# Patient Record
Sex: Female | Born: 1965 | Race: Black or African American | Hispanic: No | Marital: Single | Smoking: Never smoker
Health system: Southern US, Community
[De-identification: ages and names within clinical notes are randomized; demographics above are authoritative.]

## PROBLEM LIST (undated history)

## (undated) HISTORY — PX: TUBAL LIGATION: SHX77

---

## 2000-02-06 ENCOUNTER — Emergency Department (HOSPITAL_COMMUNITY): Admission: EM | Admit: 2000-02-06 | Discharge: 2000-02-06 | Payer: Self-pay | Admitting: Emergency Medicine

## 2000-11-13 ENCOUNTER — Emergency Department (HOSPITAL_COMMUNITY): Admission: EM | Admit: 2000-11-13 | Discharge: 2000-11-13 | Payer: Self-pay | Admitting: Emergency Medicine

## 2000-12-09 ENCOUNTER — Emergency Department (HOSPITAL_COMMUNITY): Admission: EM | Admit: 2000-12-09 | Discharge: 2000-12-09 | Payer: Self-pay | Admitting: Emergency Medicine

## 2001-01-09 ENCOUNTER — Emergency Department (HOSPITAL_COMMUNITY): Admission: EM | Admit: 2001-01-09 | Discharge: 2001-01-09 | Payer: Self-pay | Admitting: Emergency Medicine

## 2001-04-24 ENCOUNTER — Emergency Department (HOSPITAL_COMMUNITY): Admission: EM | Admit: 2001-04-24 | Discharge: 2001-04-25 | Payer: Self-pay

## 2001-04-25 ENCOUNTER — Encounter: Payer: Self-pay | Admitting: Emergency Medicine

## 2003-04-10 ENCOUNTER — Inpatient Hospital Stay (HOSPITAL_COMMUNITY): Admission: AD | Admit: 2003-04-10 | Discharge: 2003-04-10 | Payer: Self-pay | Admitting: *Deleted

## 2004-01-05 ENCOUNTER — Encounter (INDEPENDENT_AMBULATORY_CARE_PROVIDER_SITE_OTHER): Payer: Self-pay | Admitting: *Deleted

## 2004-01-05 ENCOUNTER — Encounter: Admission: RE | Admit: 2004-01-05 | Discharge: 2004-01-05 | Payer: Self-pay | Admitting: Obstetrics and Gynecology

## 2004-08-22 ENCOUNTER — Emergency Department (HOSPITAL_COMMUNITY): Admission: EM | Admit: 2004-08-22 | Discharge: 2004-08-22 | Payer: Self-pay

## 2006-05-20 ENCOUNTER — Emergency Department (HOSPITAL_COMMUNITY): Admission: EM | Admit: 2006-05-20 | Discharge: 2006-05-20 | Payer: Self-pay | Admitting: Family Medicine

## 2006-06-22 ENCOUNTER — Emergency Department (HOSPITAL_COMMUNITY): Admission: EM | Admit: 2006-06-22 | Discharge: 2006-06-22 | Payer: Self-pay | Admitting: Family Medicine

## 2006-07-08 ENCOUNTER — Emergency Department (HOSPITAL_COMMUNITY): Admission: EM | Admit: 2006-07-08 | Discharge: 2006-07-08 | Payer: Self-pay | Admitting: Family Medicine

## 2008-05-30 ENCOUNTER — Ambulatory Visit: Payer: Self-pay | Admitting: Internal Medicine

## 2008-08-19 ENCOUNTER — Emergency Department (HOSPITAL_COMMUNITY): Admission: EM | Admit: 2008-08-19 | Discharge: 2008-08-19 | Payer: Self-pay | Admitting: Family Medicine

## 2008-11-14 ENCOUNTER — Emergency Department (HOSPITAL_COMMUNITY): Admission: EM | Admit: 2008-11-14 | Discharge: 2008-11-14 | Payer: Self-pay | Admitting: Family Medicine

## 2009-09-27 ENCOUNTER — Emergency Department (HOSPITAL_COMMUNITY): Admission: EM | Admit: 2009-09-27 | Discharge: 2009-09-27 | Payer: Self-pay | Admitting: Family Medicine

## 2010-08-14 ENCOUNTER — Emergency Department (HOSPITAL_COMMUNITY)
Admission: EM | Admit: 2010-08-14 | Discharge: 2010-08-14 | Payer: Self-pay | Source: Home / Self Care | Admitting: Family Medicine

## 2010-08-14 ENCOUNTER — Ambulatory Visit: Admission: RE | Admit: 2010-08-14 | Discharge: 2010-08-14 | Payer: Self-pay | Admitting: Family Medicine

## 2010-08-14 ENCOUNTER — Encounter: Payer: Self-pay | Admitting: Family Medicine

## 2010-08-14 ENCOUNTER — Ambulatory Visit: Payer: Self-pay | Admitting: Vascular Surgery

## 2010-08-21 ENCOUNTER — Emergency Department (HOSPITAL_COMMUNITY)
Admission: EM | Admit: 2010-08-21 | Discharge: 2010-08-21 | Payer: Self-pay | Source: Home / Self Care | Admitting: Family Medicine

## 2010-09-03 ENCOUNTER — Emergency Department (HOSPITAL_COMMUNITY)
Admission: EM | Admit: 2010-09-03 | Discharge: 2010-09-03 | Payer: Self-pay | Source: Home / Self Care | Admitting: Emergency Medicine

## 2010-09-03 ENCOUNTER — Ambulatory Visit: Payer: Self-pay | Admitting: Surgery

## 2011-05-10 NOTE — Group Therapy Note (Signed)
NAME:  Sydney Sydney [name], Sydney Sydney [name]                            ACCOUNT NO.:  [id number]   MEDICAL RECORD NO.:  [id number]                   PATIENT TYPE:  OUT   LOCATION:  [facility]                           FACILITY:  [facility]   PHYSICIAN:  Tinnie Gens, MD                     DATE OF BIRTH:  11/25/1966   DATE OF SERVICE:  01/05/2004                                    CLINIC NOTE   CHIEF COMPLAINT:  Follow-up STD.   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  She has  had one outbreak.  She was culture positive.  She has had no outbreaks since  then.  She is really here just for follow-up and is without other complaints  today.   PAST MEDICAL HISTORY:  Negative.   PAST SURGICAL HISTORY:  BTL in 1989.   PAST OBSTETRICAL HISTORY:  G3, P3, NSVD x3.   PAST GYNECOLOGIC HISTORY:  Menarche at age 67, irregular, approximately 2-3  months apart, last for seven days.  No history of abnormal Paps.  Last Pap  smear approximately 15 years ago.   FAMILY HISTORY:  Coronary artery disease.   SOCIAL HISTORY:  No tobacco, alcohol, or drug use.  She lives with her  children.  She works at [employer].   REVIEW OF SYSTEMS:  14 point review of systems is reviewed and negative.   PHYSICAL EXAMINATION:  VITAL SIGNS:  Temperature 98.5, pulse 94, blood  pressure 96/62, weight 98.6 pounds, height 4 feet 11 inches.  GENERAL:  She is Sydney [name] very thin black female in no acute distress.  ABDOMEN:  Soft and nontender.  GENITOURINARY:  Normal external female genitalia.  The vagina is rugated and  without discharge.  The cervix is anterior and deviated to the patient's  right but no mass could be appreciated.  The uterus fills retroverted.  The  adnexa cannot fully be appreciated.   IMPRESSION:  1. Gynecologic examination.  2. Herpes simplex virus outbreak previously, none currently.  No symptoms.   PLAN:  1. Pap smear today.   Discussed need for mammography at age 74.  Discussed     self breast examination.  2. Discussed HSV and usual cycles as well as possible need for suppression     should outbreaks become more frequent.  The patient understood all this.     She will return as necessary.                                               Tinnie Gens, MD    TP/MEDQ  D:  01/05/2004  T:  01/05/2004  Job:  [id number]

## 2011-09-24 LAB — GC/CHLAMYDIA PROBE AMP, GENITAL
Chlamydia, DNA Probe: NEGATIVE
GC Probe Amp, Genital: NEGATIVE

## 2011-09-24 LAB — POCT URINALYSIS DIP (DEVICE)
Bilirubin Urine: NEGATIVE
Glucose, UA: NEGATIVE mg/dL
Nitrite: NEGATIVE
Specific Gravity, Urine: <=1.005 (ref 1.005–1.030)

## 2011-09-24 LAB — WET PREP, GENITAL: Clue Cells Wet Prep HPF POC: NONE SEEN

## 2014-06-09 ENCOUNTER — Encounter (HOSPITAL_COMMUNITY): Payer: Self-pay | Admitting: Emergency Medicine

## 2014-06-09 ENCOUNTER — Emergency Department (HOSPITAL_COMMUNITY)
Admission: EM | Admit: 2014-06-09 | Discharge: 2014-06-09 | Disposition: A | Discharge status: Home / Self Care | Payer: Self-pay | Attending: Emergency Medicine | Admitting: Emergency Medicine

## 2014-06-09 DIAGNOSIS — H6120 Impacted cerumen, unspecified ear: Secondary | ICD-10-CM | POA: Insufficient documentation

## 2014-06-09 DIAGNOSIS — H6122 Impacted cerumen, left ear: Secondary | ICD-10-CM

## 2014-06-09 DIAGNOSIS — H9202 Otalgia, left ear: Secondary | ICD-10-CM

## 2014-06-09 MED ORDER — CARBAMIDE PEROXIDE 6.5 % OT SOLN: 5.0000 [drp] | Freq: Two times a day (BID) | OTIC | Status: DC

## 2014-06-09 NOTE — ED Provider Notes (Signed)
CSN: [id number]     Arrival date & time 06/09/14  1127 History   First MD Initiated Contact with Patient 06/09/14 1134    This chart was scribed for Sydney FinnerErin O'Malley PA-C, a non-physician practitioner working with Layla MawKristen N Ward, DO by Lewanda RifeAlexandra Hurtado, ED Scribe. This patient was seen in room TR07C/TR07C and the patient's care was started at 1:07 PM      Chief Complaint  Patient presents with  . Otalgia     (Consider location/radiation/quality/duration/timing/severity/associated sxs/prior Treatment) The history is provided by the patient. No language interpreter was used.   HPI Comments: [name] is a 48 y.o. female who presents to the Emergency Department complaining of constant left sided otalgia onset several days. Describes pain as 4/10 in severity and popping sensation in ear. Denies any aggravating or trying any alleviating factors. Denies associated fever, recent swimming, recent trauma, recent travel, recent air plane trips, and ear drainage.   History reviewed. No pertinent past medical history. History reviewed. No pertinent past surgical history. History reviewed. No pertinent family history. History  Substance Use Topics  . Smoking status: Not on file  . Smokeless tobacco: Not on file  . Alcohol Use: No   OB History   Grav Para Term Preterm Abortions TAB SAB Ect Mult Living                 Review of Systems  Constitutional: Negative for fever.  HENT: Positive for ear pain.       Allergies  Review of patient's allergies indicates no known allergies.  Home Medications   Prior to Admission medications   Medication Sig Start Date End Date Taking? Authorizing Provider  carbamide peroxide (DEBROX) 6.5 % otic solution Place 5 drops into the left ear 2 (two) times daily. 06/09/14   Sydney FinnerErin O'Malley, PA-C   BP 110/68  Pulse 94  Temp(Src) 98.3 F (36.8 C) (Oral)  Resp 18  SpO2 98% Physical Exam  Nursing note and vitals reviewed. Constitutional: She is oriented  to person, place, and time. She appears well-developed and well-nourished.  HENT:  Head: Normocephalic and atraumatic.  Cerumen impaction in left ear   Eyes: EOM are normal.  Neck: Normal range of motion.  Cardiovascular: Normal rate.   Pulmonary/Chest: Effort normal.  Musculoskeletal: Normal range of motion.  Neurological: She is alert and oriented to person, place, and time.  Skin: Skin is warm and dry.  Psychiatric: She has a normal mood and affect. Her behavior is normal.    ED Course  Procedures   Ceruminosis is noted.  Wax is removed by syringing and manual debridement. Instructions for home care to prevent wax buildup are given.  COORDINATION OF CARE:  Nursing notes reviewed. Vital signs reviewed. Initial pt interview and examination performed.   Filed Vitals:   06/09/14 1134  BP: 110/68  Pulse: 94  Temp: 98.3 F (36.8 C)  TempSrc: Oral  Resp: 18  SpO2: 98%    1:07 PM-Discussed work up plan with pt at bedside, which includes  Orders Placed This Encounter  Procedures  . Ear wax removal    Left ear    Standing Status: Standing     Number of Occurrences: 1     Standing Expiration Date:   Pt agrees with plan.   Treatment plan initiated:Medications - No data to display   Initial diagnostic testing ordered.      Labs Review Labs Reviewed - No data to display  Imaging Review No results found.  EKG Interpretation None      MDM   Final diagnoses:  Otalgia of left ear  Impacted cerumen of left ear    Pt is a 48yo female c/o left ear pain, no fever, n/v/d. Cerumen impaction in left ear.  Large amount of cerumen removed from left ear.  Advised to to use acetaminophen and ibuprofen for pain and use Debrox to help soften remaining cerumen. Advised to f/u with PCP. Return precautions provided. Pt verbalized understanding and agreement with tx plan.   I personally performed the services described in this documentation, which was scribed in my presence.  The recorded information has been reviewed and is accurate.    Sydney Finnerrin O'Malley, PA-C 06/09/14 1312

## 2014-06-09 NOTE — ED Notes (Signed)
Reports left ear pain and popping sensation for several days.

## 2014-06-09 NOTE — Discharge Instructions (Signed)
Cerumen Impaction °A cerumen impaction is when the wax in your ear forms a plug. This plug usually causes reduced hearing. Sometimes it also causes an earache or dizziness. Removing a cerumen impaction can be difficult and painful. The wax sticks to the ear canal. The canal is sensitive and bleeds easily. If you try to remove a heavy wax buildup with a cotton tipped swab, you may push it in further. °Irrigation with water, suction, and small ear curettes may be used to clear out the wax. If the impaction is fixed to the skin in the ear canal, ear drops may be needed for a few days to loosen the wax. People who build up a lot of wax frequently can use ear wax removal products available in your local drugstore. °SEEK MEDICAL CARE IF:  °You develop an earache, increased hearing loss, or marked dizziness. °Document Released: 01/16/2005 Document Revised: 03/02/2012 Document Reviewed: 03/08/2010 °[employer]® Patient Information ©2015 [employer]. This information is not intended to replace advice given to you by your health care provider. Make sure you discuss any questions you have with your health care provider. ° °

## 2014-06-09 NOTE — ED Notes (Signed)
Pt comfortable with discharge and follow up instructions. Prescriptions x1. Pt declines wheelchair.

## 2014-06-09 NOTE — ED Provider Notes (Signed)
Medical screening examination/treatment/procedure(s) were performed by non-physician practitioner and as supervising physician I was immediately available for consultation/collaboration.   EKG Interpretation None        Kristen N Ward, DO 06/09/14 1539 

## 2014-06-09 NOTE — ED Notes (Signed)
Pt reports subjective fevers and chills one evening this past week.

## 2016-03-26 ENCOUNTER — Encounter (HOSPITAL_COMMUNITY): Payer: Self-pay | Admitting: Emergency Medicine

## 2016-03-26 ENCOUNTER — Ambulatory Visit (HOSPITAL_COMMUNITY)
Admission: EM | Admit: 2016-03-26 | Discharge: 2016-03-26 | Disposition: A | Discharge status: Home / Self Care | Payer: PRIVATE HEALTH INSURANCE | Source: Home / Self Care | Attending: Family Medicine | Admitting: Family Medicine

## 2016-03-26 ENCOUNTER — Other Ambulatory Visit (HOSPITAL_COMMUNITY)
Admission: RE | Admit: 2016-03-26 | Discharge: 2016-03-26 | Disposition: A | Discharge status: Home / Self Care | Payer: PRIVATE HEALTH INSURANCE | Source: Ambulatory Visit | Attending: Family Medicine | Admitting: Family Medicine

## 2016-03-26 ENCOUNTER — Telehealth (HOSPITAL_COMMUNITY): Payer: Self-pay | Admitting: Emergency Medicine

## 2016-03-26 DIAGNOSIS — B9689 Other specified bacterial agents as the cause of diseases classified elsewhere: Secondary | ICD-10-CM

## 2016-03-26 DIAGNOSIS — A499 Bacterial infection, unspecified: Secondary | ICD-10-CM

## 2016-03-26 DIAGNOSIS — N760 Acute vaginitis: Secondary | ICD-10-CM

## 2016-03-26 DIAGNOSIS — Z113 Encounter for screening for infections with a predominantly sexual mode of transmission: Secondary | ICD-10-CM | POA: Diagnosis not present

## 2016-03-26 MED ORDER — METRONIDAZOLE 0.75 % VA GEL: 1.0000 | Freq: Every day | VAGINAL | Status: DC

## 2016-03-26 NOTE — ED Notes (Signed)
Patient concerned for vaginal and rectal itching, denies a rash, denies vaginal discharge or urinary symptoms.  Patient reports symptoms since Saturday.  Patient reports her and boyfriend practice vaginal and anal sex and he has an infection and says she needs to be checked

## 2016-03-26 NOTE — ED Provider Notes (Signed)
CSN: [id number]     Arrival date & time 03/26/16  1459 History   First MD Initiated Contact with Patient 03/26/16 1621     Chief Complaint  Patient presents with  . Exposure to STD   (Consider location/radiation/quality/duration/timing/severity/associated sxs/prior Treatment) Patient is a 50 y.o. female presenting with STD exposure. The history is provided by the patient.  Exposure to STD This is a new problem. The current episode started more than 2 days ago (anal and vag sex over weekend , now with sx.). Pertinent negatives include no abdominal pain.    History reviewed. No pertinent past medical history. History reviewed. No pertinent past surgical history. No family history on file. Social History  Substance Use Topics  . Smoking status: Never Smoker   . Smokeless tobacco: None  . Alcohol Use: No   OB History    No data available     Review of Systems  Constitutional: Negative.   Gastrointestinal: Negative for abdominal pain.  Genitourinary: Negative for dysuria, vaginal bleeding, vaginal discharge and vaginal pain.  All other systems reviewed and are negative.   Allergies  Review of patient's allergies indicates no known allergies.  Home Medications   Prior to Admission medications   Medication Sig Start Date End Date Taking? Authorizing Provider  carbamide peroxide (DEBROX) 6.5 % otic solution Place 5 drops into the left ear 2 (two) times daily. Patient not taking: Reported on 03/26/2016 06/09/14   Junius FinnerErin O'Malley, PA-C  metroNIDAZOLE (METROGEL VAGINAL) 0.75 % vaginal gel Place 1 Applicatorful vaginally at bedtime. For 5 nights 03/26/16   Linna HoffJames D Kindl, MD   Meds Ordered and Administered this Visit  Medications - No data to display  BP 115/70 mmHg  Pulse 88  Temp(Src) 98.3 F (36.8 C) (Oral)  Resp 14  SpO2 97%  LMP 02/25/2016 No data found.   Physical Exam  Constitutional: She is oriented to person, place, and time. She appears well-developed and  well-nourished.  Abdominal: Soft. Bowel sounds are normal.  Genitourinary: Vagina normal. No vaginal discharge found.  Neurological: She is alert and oriented to person, place, and time.  Skin: Skin is warm and dry.  Nursing note and vitals reviewed.   ED Course  Procedures (including critical care time)  Labs Review Labs Reviewed  CYTOLOGY, (ORAL, ANAL, URETHRAL) ANCILLARY ONLY    Imaging Review No results found.   Visual Acuity Review  Right Eye Distance:   Left Eye Distance:   Bilateral Distance:    Right Eye Near:   Left Eye Near:    Bilateral Near:         MDM   1. BV (bacterial vaginosis)    Meds ordered this encounter  Medications  . metroNIDAZOLE (METROGEL VAGINAL) 0.75 % vaginal gel    Sig: Place 1 Applicatorful vaginally at bedtime. For 5 nights    Dispense:  70 g    Refill:  0       Linna HoffJames D Kindl, MD 03/27/16 1800

## 2016-03-26 NOTE — Discharge Instructions (Signed)
We will call with positive test results and treat as indicated  °

## 2016-03-27 LAB — CYTOLOGY, (ORAL, ANAL, URETHRAL) ANCILLARY ONLY
Chlamydia: NEGATIVE
NEISSERIA GONORRHEA: NEGATIVE

## 2016-03-27 NOTE — ED Notes (Signed)
Late entry 03/27/16 12:55.  Patient record accessed for question that gloria in the lab had about an order dr Artis Flockkindl incorrectly placed.  Clarified order, location where specimen was obtained was clarified.  This nurse at bedside for specimen collection.  [id number], rn

## 2016-03-28 LAB — CERVICOVAGINAL ANCILLARY ONLY: Wet Prep (BD Affirm): POSITIVE — AB

## 2016-10-12 ENCOUNTER — Ambulatory Visit (INDEPENDENT_AMBULATORY_CARE_PROVIDER_SITE_OTHER): Payer: PRIVATE HEALTH INSURANCE | Admitting: Family Medicine

## 2016-10-12 ENCOUNTER — Ambulatory Visit (INDEPENDENT_AMBULATORY_CARE_PROVIDER_SITE_OTHER): Payer: PRIVATE HEALTH INSURANCE

## 2016-10-12 VITALS — BP 110/70 | HR 76 | Temp 98.1°F | Resp 16 | Ht 58.75 in | Wt 107.0 lb

## 2016-10-12 DIAGNOSIS — M25511 Pain in right shoulder: Secondary | ICD-10-CM

## 2016-10-12 DIAGNOSIS — M67911 Unspecified disorder of synovium and tendon, right shoulder: Secondary | ICD-10-CM | POA: Diagnosis not present

## 2016-10-12 MED ORDER — MELOXICAM 7.5 MG PO TABS: 7.5000 mg | ORAL_TABLET | Freq: Every day | ORAL | 0 refills | Status: DC

## 2016-10-12 NOTE — Patient Instructions (Addendum)
Your symptoms appear to be due to a tendinopathy or rotator cuff muscle soreness of your shoulder. You can try the meloxicam 1 pill once per day for the next week to 10 days at the most, but if that is not improving, return here or I can refer you to an orthopedist. Try to work on range of motion as we discussed, and see other information on handout below.  Return to the clinic or go to the nearest emergency room if any of your symptoms worsen or new symptoms occur.   Impingement Syndrome, Rotator Cuff, Bursitis With Rehab Impingement syndrome is a condition that involves inflammation of the tendons of the rotator cuff and the subacromial bursa, that causes pain in the shoulder. The rotator cuff consists of four tendons and muscles that control much of the shoulder and upper arm function. The subacromial bursa is a fluid filled sac that helps reduce friction between the rotator cuff and one of the bones of the shoulder (acromion). Impingement syndrome is usually an overuse injury that causes swelling of the bursa (bursitis), swelling of the tendon (tendonitis), and/or a tear of the tendon (strain). Strains are classified into three categories. Grade 1 strains cause pain, but the tendon is not lengthened. Grade 2 strains include a lengthened ligament, due to the ligament being stretched or partially ruptured. With grade 2 strains there is still function, although the function may be decreased. Grade 3 strains include a complete tear of the tendon or muscle, and function is usually impaired. SYMPTOMS   Pain around the shoulder, often at the outer portion of the upper arm.  Pain that gets worse with shoulder function, especially when reaching overhead or lifting.  Sometimes, aching when not using the arm.  Pain that wakes you up at night.  Sometimes, tenderness, swelling, warmth, or redness over the affected area.  Loss of strength.  Limited motion of the shoulder, especially reaching behind the  back (to the back pocket or to unhook bra) or across your body.  Crackling sound (crepitation) when moving the arm.  Biceps tendon pain and inflammation (in the front of the shoulder). Worse when bending the elbow or lifting. CAUSES  Impingement syndrome is often an overuse injury, in which chronic (repetitive) motions cause the tendons or bursa to become inflamed. A strain occurs when a force is paced on the tendon or muscle that is greater than it can withstand. Common mechanisms of injury include: Stress from sudden increase in duration, frequency, or intensity of training.  Direct hit (trauma) to the shoulder.  Aging, erosion of the tendon with normal use.  Bony bump on shoulder (acromial spur). RISK INCREASES WITH:  Contact sports (football, wrestling, boxing).  Throwing sports (baseball, tennis, volleyball).  Weightlifting and bodybuilding.  Heavy labor.  Previous injury to the rotator cuff, including impingement.  Poor shoulder strength and flexibility.  Failure to warm up properly before activity.  Inadequate protective equipment.  Old age.  Bony bump on shoulder (acromial spur). PREVENTION   Warm up and stretch properly before activity.  Allow for adequate recovery between workouts.  Maintain physical fitness:  Strength, flexibility, and endurance.  Cardiovascular fitness.  Learn and use proper exercise technique. PROGNOSIS  If treated properly, impingement syndrome usually goes away within 6 weeks. Sometimes surgery is required.  RELATED COMPLICATIONS   Longer healing time if not properly treated, or if not given enough time to heal.  Recurring symptoms, that result in a chronic condition.  Shoulder stiffness, frozen shoulder, or  loss of motion.  Rotator cuff tendon tear.  Recurring symptoms, especially if activity is resumed too soon, with overuse, with a direct blow, or when using poor technique. TREATMENT  Treatment first involves the use of  ice and medicine, to reduce pain and inflammation. The use of strengthening and stretching exercises may help reduce pain with activity. These exercises may be performed at home or with a therapist. If non-surgical treatment is unsuccessful after more than 6 months, surgery may be advised. After surgery and rehabilitation, activity is usually possible in 3 months.  MEDICATION  If pain medicine is needed, nonsteroidal anti-inflammatory medicines (aspirin and ibuprofen), or other minor pain relievers (acetaminophen), are often advised.  Do not take pain medicine for 7 days before surgery.  Prescription pain relievers may be given, if your caregiver thinks they are needed. Use only as directed and only as much as you need.  Corticosteroid injections may be given by your caregiver. These injections should be reserved for the most serious cases, because they may only be given a certain number of times. HEAT AND COLD  Cold treatment (icing) should be applied for 10 to 15 minutes every 2 to 3 hours for inflammation and pain, and immediately after activity that aggravates your symptoms. Use ice packs or an ice massage.  Heat treatment may be used before performing stretching and strengthening activities prescribed by your caregiver, physical therapist, or athletic trainer. Use a heat pack or a warm water soak. SEEK MEDICAL CARE IF:   Symptoms get worse or do not improve in 4 to 6 weeks, despite treatment.  New, unexplained symptoms develop. (Drugs used in treatment may produce side effects.) EXERCISES  RANGE OF MOTION (ROM) AND STRETCHING EXERCISES - Impingement Syndrome (Rotator Cuff  Tendinitis, Bursitis) These exercises may help you when beginning to rehabilitate your injury. Your symptoms may go away with or without further involvement from your physician, physical therapist or athletic trainer. While completing these exercises, remember:   Restoring tissue flexibility helps normal motion to  return to the joints. This allows healthier, less painful movement and activity.  An effective stretch should be held for at least 30 seconds.  A stretch should never be painful. You should only feel a gentle lengthening or release in the stretched tissue. STRETCH - Flexion, Standing  Stand with good posture. With an underhand grip on your right / left hand, and an overhand grip on the opposite hand, grasp a broomstick or cane so that your hands are a little more than shoulder width apart.  Keeping your right / left elbow straight and shoulder muscles relaxed, push the stick with your opposite hand, to raise your right / left arm in front of your body and then overhead. Raise your arm until you feel a stretch in your right / left shoulder, but before you have increased shoulder pain.  Try to avoid shrugging your right / left shoulder as your arm rises, by keeping your shoulder blade tucked down and toward your mid-back spine. Hold for __________ seconds.  Slowly return to the starting position. Repeat __________ times. Complete this exercise __________ times per day. STRETCH - Abduction, Supine  Lie on your back. With an underhand grip on your right / left hand and an overhand grip on the opposite hand, grasp a broomstick or cane so that your hands are a little more than shoulder width apart.  Keeping your right / left elbow straight and your shoulder muscles relaxed, push the stick with your opposite  hand, to raise your right / left arm out to the side of your body and then overhead. Raise your arm until you feel a stretch in your right / left shoulder, but before you have increased shoulder pain.  Try to avoid shrugging your right / left shoulder as your arm rises, by keeping your shoulder blade tucked down and toward your mid-back spine. Hold for __________ seconds.  Slowly return to the starting position. Repeat __________ times. Complete this exercise __________ times per day. ROM -  Flexion, Active-Assisted  Lie on your back. You may bend your knees for comfort.  Grasp a broomstick or cane so your hands are about shoulder width apart. Your right / left hand should grip the end of the stick, so that your hand is positioned "thumbs-up," as if you were about to shake hands.  Using your healthy arm to lead, raise your right / left arm overhead, until you feel a gentle stretch in your shoulder. Hold for __________ seconds.  Use the stick to assist in returning your right / left arm to its starting position. Repeat __________ times. Complete this exercise __________ times per day.  ROM - Internal Rotation, Supine   Lie on your back on a firm surface. Place your right / left elbow about 60 degrees away from your side. Elevate your elbow with a folded towel, so that the elbow and shoulder are the same height.  Using a broomstick or cane and your strong arm, pull your right / left hand toward your body until you feel a gentle stretch, but no increase in your shoulder pain. Keep your shoulder and elbow in place throughout the exercise.  Hold for __________ seconds. Slowly return to the starting position. Repeat __________ times. Complete this exercise __________ times per day. STRETCH - Internal Rotation  Place your right / left hand behind your back, palm up.  Throw a towel or belt over your opposite shoulder. Grasp the towel with your right / left hand.  While keeping an upright posture, gently pull up on the towel, until you feel a stretch in the front of your right / left shoulder.  Avoid shrugging your right / left shoulder as your arm rises, by keeping your shoulder blade tucked down and toward your mid-back spine.  Hold for __________ seconds. Release the stretch, by lowering your healthy hand. Repeat __________ times. Complete this exercise __________ times per day. ROM - Internal Rotation   Using an underhand grip, grasp a stick behind your back with both  hands.  While standing upright with good posture, slide the stick up your back until you feel a mild stretch in the front of your shoulder.  Hold for __________ seconds. Slowly return to your starting position. Repeat __________ times. Complete this exercise __________ times per day.  STRETCH - Posterior Shoulder Capsule   Stand or sit with good posture. Grasp your right / left elbow and draw it across your chest, keeping it at the same height as your shoulder.  Pull your elbow, so your upper arm comes in closer to your chest. Pull until you feel a gentle stretch in the back of your shoulder.  Hold for __________ seconds. Repeat __________ times. Complete this exercise __________ times per day. STRENGTHENING EXERCISES - Impingement Syndrome (Rotator Cuff Tendinitis, Bursitis) These exercises may help you when beginning to rehabilitate your injury. They may resolve your symptoms with or without further involvement from your physician, physical therapist or athletic trainer. While completing these exercises,  remember:  Muscles can gain both the endurance and the strength needed for everyday activities through controlled exercises.  Complete these exercises as instructed by your physician, physical therapist or athletic trainer. Increase the resistance and repetitions only as guided.  You may experience muscle soreness or fatigue, but the pain or discomfort you are trying to eliminate should never worsen during these exercises. If this pain does get worse, stop and make sure you are following the directions exactly. If the pain is still present after adjustments, discontinue the exercise until you can discuss the trouble with your clinician.  During your recovery, avoid activity or exercises which involve actions that place your injured hand or elbow above your head or behind your back or head. These positions stress the tissues which you are trying to heal. STRENGTH - Scapular Depression and  Adduction   With good posture, sit on a firm chair. Support your arms in front of you, with pillows, arm rests, or on a table top. Have your elbows in line with the sides of your body.  Gently draw your shoulder blades down and toward your mid-back spine. Gradually increase the tension, without tensing the muscles along the top of your shoulders and the back of your neck.  Hold for __________ seconds. Slowly release the tension and relax your muscles completely before starting the next repetition.  After you have practiced this exercise, remove the arm support and complete the exercise in standing as well as sitting position. Repeat __________ times. Complete this exercise __________ times per day.  STRENGTH - Shoulder Abductors, Isometric  With good posture, stand or sit about 4-6 inches from a wall, with your right / left side facing the wall.  Bend your right / left elbow. Gently press your right / left elbow into the wall. Increase the pressure gradually, until you are pressing as hard as you can, without shrugging your shoulder or increasing any shoulder discomfort.  Hold for __________ seconds.  Release the tension slowly. Relax your shoulder muscles completely before you begin the next repetition. Repeat __________ times. Complete this exercise __________ times per day.  STRENGTH - External Rotators, Isometric  Keep your right / left elbow at your side and bend it 90 degrees.  Step into a door frame so that the outside of your right / left wrist can press against the door frame without your upper arm leaving your side.  Gently press your right / left wrist into the door frame, as if you were trying to swing the back of your hand away from your stomach. Gradually increase the tension, until you are pressing as hard as you can, without shrugging your shoulder or increasing any shoulder discomfort.  Hold for __________ seconds.  Release the tension slowly. Relax your shoulder  muscles completely before you begin the next repetition. Repeat __________ times. Complete this exercise __________ times per day.  STRENGTH - Supraspinatus   Stand or sit with good posture. Grasp a __________ weight, or an exercise band or tubing, so that your hand is "thumbs-up," like you are shaking hands.  Slowly lift your right / left arm in a "V" away from your thigh, diagonally into the space between your side and straight ahead. Lift your hand to shoulder height or as far as you can, without increasing any shoulder pain. At first, many people do not lift their hands above shoulder height.  Avoid shrugging your right / left shoulder as your arm rises, by keeping your shoulder blade tucked  down and toward your mid-back spine.  Hold for __________ seconds. Control the descent of your hand, as you slowly return to your starting position. Repeat __________ times. Complete this exercise __________ times per day.  STRENGTH - External Rotators  Secure a rubber exercise band or tubing to a fixed object (table, pole) so that it is at the same height as your right / left elbow when you are standing or sitting on a firm surface.  Stand or sit so that the secured exercise band is at your uninjured side.  Bend your right / left elbow 90 degrees. Place a folded towel or small pillow under your right / left arm, so that your elbow is a few inches away from your side.  Keeping the tension on the exercise band, pull it away from your body, as if pivoting on your elbow. Be sure to keep your body steady, so that the movement is coming only from your rotating shoulder.  Hold for __________ seconds. Release the tension in a controlled manner, as you return to the starting position. Repeat __________ times. Complete this exercise __________ times per day.  STRENGTH - Internal Rotators   Secure a rubber exercise band or tubing to a fixed object (table, pole) so that it is at the same height as your right /  left elbow when you are standing or sitting on a firm surface.  Stand or sit so that the secured exercise band is at your right / left side.  Bend your elbow 90 degrees. Place a folded towel or small pillow under your right / left arm so that your elbow is a few inches away from your side.  Keeping the tension on the exercise band, pull it across your body, toward your stomach. Be sure to keep your body steady, so that the movement is coming only from your rotating shoulder.  Hold for __________ seconds. Release the tension in a controlled manner, as you return to the starting position. Repeat __________ times. Complete this exercise __________ times per day.  STRENGTH - Scapular Protractors, Standing   Stand arms length away from a wall. Place your hands on the wall, keeping your elbows straight.  Begin by dropping your shoulder blades down and toward your mid-back spine.  To strengthen your protractors, keep your shoulder blades down, but slide them forward on your rib cage. It will feel as if you are lifting the back of your rib cage away from the wall. This is a subtle motion and can be challenging to complete. Ask your caregiver for further instruction, if you are not sure you are doing the exercise correctly.  Hold for __________ seconds. Slowly return to the starting position, resting the muscles completely before starting the next repetition. Repeat __________ times. Complete this exercise __________ times per day. STRENGTH - Scapular Protractors, Supine  Lie on your back on a firm surface. Extend your right / left arm straight into the air while holding a __________ weight in your hand.  Keeping your head and back in place, lift your shoulder off the floor.  Hold for __________ seconds. Slowly return to the starting position, and allow your muscles to relax completely before starting the next repetition. Repeat __________ times. Complete this exercise __________ times per  day. STRENGTH - Scapular Protractors, Quadruped  Get onto your hands and knees, with your shoulders directly over your hands (or as close as you can be, comfortably).  Keeping your elbows locked, lift the back of your rib  cage up into your shoulder blades, so your mid-back rounds out. Keep your neck muscles relaxed.  Hold this position for __________ seconds. Slowly return to the starting position and allow your muscles to relax completely before starting the next repetition. Repeat __________ times. Complete this exercise __________ times per day.  STRENGTH - Scapular Retractors  Secure a rubber exercise band or tubing to a fixed object (table, pole), so that it is at the height of your shoulders when you are either standing, or sitting on a firm armless chair.  With a palm down grip, grasp an end of the band in each hand. Straighten your elbows and lift your hands straight in front of you, at shoulder height. Step back, away from the secured end of the band, until it becomes tense.  Squeezing your shoulder blades together, draw your elbows back toward your sides, as you bend them. Keep your upper arms lifted away from your body throughout the exercise.  Hold for __________ seconds. Slowly ease the tension on the band, as you reverse the directions and return to the starting position. Repeat __________ times. Complete this exercise __________ times per day. STRENGTH - Shoulder Extensors   Secure a rubber exercise band or tubing to a fixed object (table, pole) so that it is at the height of your shoulders when you are either standing, or sitting on a firm armless chair.  With a thumbs-up grip, grasp an end of the band in each hand. Straighten your elbows and lift your hands straight in front of you, at shoulder height. Step back, away from the secured end of the band, until it becomes tense.  Squeezing your shoulder blades together, pull your hands down to the sides of your thighs. Do not  allow your hands to go behind you.  Hold for __________ seconds. Slowly ease the tension on the band, as you reverse the directions and return to the starting position. Repeat __________ times. Complete this exercise __________ times per day.  STRENGTH - Scapular Retractors and External Rotators   Secure a rubber exercise band or tubing to a fixed object (table, pole) so that it is at the height as your shoulders, when you are either standing, or sitting on a firm armless chair.  With a palm down grip, grasp an end of the band in each hand. Bend your elbows 90 degrees and lift your elbows to shoulder height, at your sides. Step back, away from the secured end of the band, until it becomes tense.  Squeezing your shoulder blades together, rotate your shoulders so that your upper arms and elbows remain stationary, but your fists travel upward to head height.  Hold for __________ seconds. Slowly ease the tension on the band, as you reverse the directions and return to the starting position. Repeat __________ times. Complete this exercise __________ times per day.  STRENGTH - Scapular Retractors and External Rotators, Rowing   Secure a rubber exercise band or tubing to a fixed object (table, pole) so that it is at the height of your shoulders, when you are either standing, or sitting on a firm armless chair.  With a palm down grip, grasp an end of the band in each hand. Straighten your elbows and lift your hands straight in front of you, at shoulder height. Step back, away from the secured end of the band, until it becomes tense.  Step 1: Squeeze your shoulder blades together. Bending your elbows, draw your hands to your chest, as if you are  rowing a boat. At the end of this motion, your hands and elbow should be at shoulder height and your elbows should be out to your sides.  Step 2: Rotate your shoulders, to raise your hands above your head. Your forearms should be vertical and your upper arms  should be horizontal.  Hold for __________ seconds. Slowly ease the tension on the band, as you reverse the directions and return to the starting position. Repeat __________ times. Complete this exercise __________ times per day.  STRENGTH - Scapular Depressors  Find a sturdy chair without wheels, such as a dining room chair.  Keeping your feet on the floor, and your hands on the chair arms, lift your bottom up from the seat, and lock your elbows.  Keeping your elbows straight, allow gravity to pull your body weight down. Your shoulders will rise toward your ears.  Raise your body against gravity by drawing your shoulder blades down your back, shortening the distance between your shoulders and ears. Although your feet should always maintain contact with the floor, your feet should progressively support less body weight, as you get stronger.  Hold for __________ seconds. In a controlled and slow manner, lower your body weight to begin the next repetition. Repeat __________ times. Complete this exercise __________ times per day.    This information is not intended to replace advice given to you by your health care provider. Make sure you discuss any questions you have with your health care provider.   Document Released: 12/09/2005 Document Revised: 12/30/2014 Document Reviewed: 03/23/2009 Elsevier Interactive Patient Education 2016 [employer].     IF you received an x-ray today, you will receive an invoice from [facility]. Please contact [facility] at [phone] with questions or concerns regarding your invoice.   IF you received labwork today, you will receive an invoice from [employer]. Please contact [facility] at [phone] with questions or concerns regarding your invoice.   Our billing staff will not be able to assist you with questions regarding bills from these companies.  You will be contacted with the lab results as soon as  they are available. The fastest way to get your results is to activate your My Chart account. Instructions are located on the last page of this paperwork. If you have not heard from [place] regarding the results in 2 weeks, please contact this office.

## 2016-10-12 NOTE — Progress Notes (Addendum)
Subjective:  By signing my name below, I, Stann Ore, attest that this documentation has been prepared under the direction and in the presence of Meredith Staggers, MD. Electronically Signed: Stann Ore, Scribe. 10/12/2016 , 12:10 PM .  Patient was seen in Room 14 .   Patient ID: Sydney [name], female    DOB: 1966/05/06, 50 y.o.   MRN: [id number] Chief Complaint  Patient presents with  . Arm Pain    right   HPI Sydney [name] is a 51 y.o. female Here for right arm pain.   Patient states she's been having right upper arm pain for about a month now. She initially thought she slept on it wrong. She has pain when lifting her right arm, and worsened over the past week. She denies neck pain or neck stiffness. She denies any known injuries to the area. She denies diabetes or other medical problems. This arm pain is new to her. She's tried applying "Icy Hot" to the area without any relief. She denies any new work activities, overhead activities, or any new exercises.   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   There are no active problems to display for this patient.  No past medical history on file. No past surgical history on file. No Known Allergies Prior to Admission medications   Medication Sig Start Date End Date Taking? Authorizing Provider  carbamide peroxide (DEBROX) 6.5 % otic solution Place 5 drops into the left ear 2 (two) times daily. Patient not taking: Reported on 10/12/2016 06/09/14   Junius Finner, PA-C  metroNIDAZOLE (METROGEL VAGINAL) 0.75 % vaginal gel Place 1 Applicatorful vaginally at bedtime. For 5 nights Patient not taking: Reported on 10/12/2016 03/26/16   Linna Hoff, MD   Social History   Social History  . Marital status: Single    Spouse name: N/A  . Number of children: N/A  . Years of education: N/A   Occupational History  . Not on file.   Social History Main Topics  . Smoking status: Never Smoker  . Smokeless tobacco: Not on file  .  Alcohol use No  . Drug use: No  . Sexual activity: Not on file   Other Topics Concern  . Not on file   Social History Narrative  . No narrative on file   Review of Systems  Constitutional: Negative for activity change, chills, fatigue and fever.  Respiratory: Negative for chest tightness.   Cardiovascular: Negative for chest pain.  Gastrointestinal: Negative for abdominal pain.  Musculoskeletal: Positive for arthralgias and myalgias. Negative for back pain, joint swelling, neck pain and neck stiffness.  Neurological: Negative for dizziness, weakness, numbness and headaches.       Objective:   Physical Exam  Constitutional: She is oriented to person, place, and time. She appears well-developed and well-nourished. No distress.  HENT:  Head: Normocephalic and atraumatic.  Eyes: EOM are normal. Pupils are equal, round, and reactive to light.  Neck: Neck supple.  Slight decreased extension, no pain with C-spine ROM, C-spine non tender, paraspinals non tender  Cardiovascular: Normal rate.   Pulmonary/Chest: Effort normal. No respiratory distress.  Musculoskeletal: Normal range of motion.  Wrist, and elbow full ROM non tender, no focal bony tenderness, biceps and triceps non tender with strength intact, [place] joint and AC joint non tender Guarded with flexion to about 80 degrees on the right, abduction to 90 degrees, internal rotation to L4-L5 on the right; intact flexion and abduction with passive ROM  and pain free; weak with pain on external rotation as well as empty can testing, negative neer's  Neurological: She is alert and oriented to person, place, and time.  Skin: Skin is warm and dry.  Psychiatric: She has a normal mood and affect. Her behavior is normal.  Nursing note and vitals reviewed.   Vitals:   10/12/16 1141  BP: 110/70  Pulse: 76  Resp: 16  Temp: 98.1 F (36.7 C)  TempSrc: Oral  SpO2: 100%  Weight: 107 lb (48.5 kg)  Height: 4' 10.75" (1.492 m)    Dg Shoulder  Right  Result Date: 10/12/2016 CLINICAL DATA:  Right shoulder pain EXAM: RIGHT SHOULDER - 2+ VIEW COMPARISON:  None. FINDINGS: Three views of the right shoulder submitted. No acute fracture or subluxation. No radiopaque foreign body. IMPRESSION: Negative. Electronically Signed   By: Natasha Mead M.D.   On: 10/12/2016 12:24       Assessment & Plan:    Sydney [name] is a 50 y.o. female Pain in joint of right shoulder - Plan: meloxicam (MOBIC) 7.5 MG tablet, DG Shoulder Right Tendinopathy of rotator cuff, right - Plan: meloxicam (MOBIC) 7.5 MG tablet  - possible impingement component versus rotator cuff tendinopathy. No known injury, no concerning findings on x-ray. Initial symptoms appeared to be possible adhesive capsulitis, but I am able to push through range of motion passively without significant difficulty.    -start meloxicam 7.5 mg daily, trial of home exercises with range of motion initially, and recheck in the next 7-10 days. If not significant improvement at that time, consider injection versus referral to orthopedist.   Meds ordered this encounter  Medications  . meloxicam (MOBIC) 7.5 MG tablet    Sig: Take 1 tablet (7.5 mg total) by mouth daily.    Dispense:  30 tablet    Refill:  0   Patient Instructions    Your symptoms appear to be due to a tendinopathy or rotator cuff muscle soreness of your shoulder. You can try the meloxicam 1 pill once per day for the next week to 10 days at the most, but if that is not improving, return here or I can refer you to an orthopedist. Try to work on range of motion as we discussed, and see other information on handout below.  Return to the clinic or go to the nearest emergency room if any of your symptoms worsen or new symptoms occur.   Impingement Syndrome, Rotator Cuff, Bursitis With Rehab Impingement syndrome is a condition that involves inflammation of the tendons of the rotator cuff and the subacromial bursa, that causes pain in the  shoulder. The rotator cuff consists of four tendons and muscles that control much of the shoulder and upper arm function. The subacromial bursa is a fluid filled sac that helps reduce friction between the rotator cuff and one of the bones of the shoulder (acromion). Impingement syndrome is usually an overuse injury that causes swelling of the bursa (bursitis), swelling of the tendon (tendonitis), and/or a tear of the tendon (strain). Strains are classified into three categories. Grade 1 strains cause pain, but the tendon is not lengthened. Grade 2 strains include a lengthened ligament, due to the ligament being stretched or partially ruptured. With grade 2 strains there is still function, although the function may be decreased. Grade 3 strains include a complete tear of the tendon or muscle, and function is usually impaired. SYMPTOMS   Pain around the shoulder, often at the outer portion of  the upper arm.  Pain that gets worse with shoulder function, especially when reaching overhead or lifting.  Sometimes, aching when not using the arm.  Pain that wakes you up at night.  Sometimes, tenderness, swelling, warmth, or redness over the affected area.  Loss of strength.  Limited motion of the shoulder, especially reaching behind the back (to the back pocket or to unhook bra) or across your body.  Crackling sound (crepitation) when moving the arm.  Biceps tendon pain and inflammation (in the front of the shoulder). Worse when bending the elbow or lifting. CAUSES  Impingement syndrome is often an overuse injury, in which chronic (repetitive) motions cause the tendons or bursa to become inflamed. A strain occurs when a force is paced on the tendon or muscle that is greater than it can withstand. Common mechanisms of injury include: Stress from sudden increase in duration, frequency, or intensity of training.  Direct hit (trauma) to the shoulder.  Aging, erosion of the tendon with normal  use.  Bony bump on shoulder (acromial spur). RISK INCREASES WITH:  Contact sports (football, wrestling, boxing).  Throwing sports (baseball, tennis, volleyball).  Weightlifting and bodybuilding.  Heavy labor.  Previous injury to the rotator cuff, including impingement.  Poor shoulder strength and flexibility.  Failure to warm up properly before activity.  Inadequate protective equipment.  Old age.  Bony bump on shoulder (acromial spur). PREVENTION   Warm up and stretch properly before activity.  Allow for adequate recovery between workouts.  Maintain physical fitness:  Strength, flexibility, and endurance.  Cardiovascular fitness.  Learn and use proper exercise technique. PROGNOSIS  If treated properly, impingement syndrome usually goes away within 6 weeks. Sometimes surgery is required.  RELATED COMPLICATIONS   Longer healing time if not properly treated, or if not given enough time to heal.  Recurring symptoms, that result in a chronic condition.  Shoulder stiffness, frozen shoulder, or loss of motion.  Rotator cuff tendon tear.  Recurring symptoms, especially if activity is resumed too soon, with overuse, with a direct blow, or when using poor technique. TREATMENT  Treatment first involves the use of ice and medicine, to reduce pain and inflammation. The use of strengthening and stretching exercises may help reduce pain with activity. These exercises may be performed at home or with a therapist. If non-surgical treatment is unsuccessful after more than 6 months, surgery may be advised. After surgery and rehabilitation, activity is usually possible in 3 months.  MEDICATION  If pain medicine is needed, nonsteroidal anti-inflammatory medicines (aspirin and ibuprofen), or other minor pain relievers (acetaminophen), are often advised.  Do not take pain medicine for 7 days before surgery.  Prescription pain relievers may be given, if your caregiver thinks they  are needed. Use only as directed and only as much as you need.  Corticosteroid injections may be given by your caregiver. These injections should be reserved for the most serious cases, because they may only be given a certain number of times. HEAT AND COLD  Cold treatment (icing) should be applied for 10 to 15 minutes every 2 to 3 hours for inflammation and pain, and immediately after activity that aggravates your symptoms. Use ice packs or an ice massage.  Heat treatment may be used before performing stretching and strengthening activities prescribed by your caregiver, physical therapist, or athletic trainer. Use a heat pack or a warm water soak. SEEK MEDICAL CARE IF:   Symptoms get worse or do not improve in 4 to 6 weeks, despite treatment.  New, unexplained symptoms develop. (Drugs used in treatment may produce side effects.) EXERCISES  RANGE OF MOTION (ROM) AND STRETCHING EXERCISES - Impingement Syndrome (Rotator Cuff  Tendinitis, Bursitis) These exercises may help you when beginning to rehabilitate your injury. Your symptoms may go away with or without further involvement from your physician, physical therapist or athletic trainer. While completing these exercises, remember:   Restoring tissue flexibility helps normal motion to return to the joints. This allows healthier, less painful movement and activity.  An effective stretch should be held for at least 30 seconds.  A stretch should never be painful. You should only feel a gentle lengthening or release in the stretched tissue. STRETCH - Flexion, Standing  Stand with good posture. With an underhand grip on your right / left hand, and an overhand grip on the opposite hand, grasp a broomstick or cane so that your hands are a little more than shoulder width apart.  Keeping your right / left elbow straight and shoulder muscles relaxed, push the stick with your opposite hand, to raise your right / left arm in front of your body and then  overhead. Raise your arm until you feel a stretch in your right / left shoulder, but before you have increased shoulder pain.  Try to avoid shrugging your right / left shoulder as your arm rises, by keeping your shoulder blade tucked down and toward your mid-back spine. Hold for __________ seconds.  Slowly return to the starting position. Repeat __________ times. Complete this exercise __________ times per day. STRETCH - Abduction, Supine  Lie on your back. With an underhand grip on your right / left hand and an overhand grip on the opposite hand, grasp a broomstick or cane so that your hands are a little more than shoulder width apart.  Keeping your right / left elbow straight and your shoulder muscles relaxed, push the stick with your opposite hand, to raise your right / left arm out to the side of your body and then overhead. Raise your arm until you feel a stretch in your right / left shoulder, but before you have increased shoulder pain.  Try to avoid shrugging your right / left shoulder as your arm rises, by keeping your shoulder blade tucked down and toward your mid-back spine. Hold for __________ seconds.  Slowly return to the starting position. Repeat __________ times. Complete this exercise __________ times per day. ROM - Flexion, Active-Assisted  Lie on your back. You may bend your knees for comfort.  Grasp a broomstick or cane so your hands are about shoulder width apart. Your right / left hand should grip the end of the stick, so that your hand is positioned "thumbs-up," as if you were about to shake hands.  Using your healthy arm to lead, raise your right / left arm overhead, until you feel a gentle stretch in your shoulder. Hold for __________ seconds.  Use the stick to assist in returning your right / left arm to its starting position. Repeat __________ times. Complete this exercise __________ times per day.  ROM - Internal Rotation, Supine   Lie on your back on a firm  surface. Place your right / left elbow about 60 degrees away from your side. Elevate your elbow with a folded towel, so that the elbow and shoulder are the same height.  Using a broomstick or cane and your strong arm, pull your right / left hand toward your body until you feel a gentle stretch, but no increase in your  shoulder pain. Keep your shoulder and elbow in place throughout the exercise.  Hold for __________ seconds. Slowly return to the starting position. Repeat __________ times. Complete this exercise __________ times per day. STRETCH - Internal Rotation  Place your right / left hand behind your back, palm up.  Throw a towel or belt over your opposite shoulder. Grasp the towel with your right / left hand.  While keeping an upright posture, gently pull up on the towel, until you feel a stretch in the front of your right / left shoulder.  Avoid shrugging your right / left shoulder as your arm rises, by keeping your shoulder blade tucked down and toward your mid-back spine.  Hold for __________ seconds. Release the stretch, by lowering your healthy hand. Repeat __________ times. Complete this exercise __________ times per day. ROM - Internal Rotation   Using an underhand grip, grasp a stick behind your back with both hands.  While standing upright with good posture, slide the stick up your back until you feel a mild stretch in the front of your shoulder.  Hold for __________ seconds. Slowly return to your starting position. Repeat __________ times. Complete this exercise __________ times per day.  STRETCH - Posterior Shoulder Capsule   Stand or sit with good posture. Grasp your right / left elbow and draw it across your chest, keeping it at the same height as your shoulder.  Pull your elbow, so your upper arm comes in closer to your chest. Pull until you feel a gentle stretch in the back of your shoulder.  Hold for __________ seconds. Repeat __________ times. Complete this  exercise __________ times per day. STRENGTHENING EXERCISES - Impingement Syndrome (Rotator Cuff Tendinitis, Bursitis) These exercises may help you when beginning to rehabilitate your injury. They may resolve your symptoms with or without further involvement from your physician, physical therapist or athletic trainer. While completing these exercises, remember:  Muscles can gain both the endurance and the strength needed for everyday activities through controlled exercises.  Complete these exercises as instructed by your physician, physical therapist or athletic trainer. Increase the resistance and repetitions only as guided.  You may experience muscle soreness or fatigue, but the pain or discomfort you are trying to eliminate should never worsen during these exercises. If this pain does get worse, stop and make sure you are following the directions exactly. If the pain is still present after adjustments, discontinue the exercise until you can discuss the trouble with your clinician.  During your recovery, avoid activity or exercises which involve actions that place your injured hand or elbow above your head or behind your back or head. These positions stress the tissues which you are trying to heal. STRENGTH - Scapular Depression and Adduction   With good posture, sit on a firm chair. Support your arms in front of you, with pillows, arm rests, or on a table top. Have your elbows in line with the sides of your body.  Gently draw your shoulder blades down and toward your mid-back spine. Gradually increase the tension, without tensing the muscles along the top of your shoulders and the back of your neck.  Hold for __________ seconds. Slowly release the tension and relax your muscles completely before starting the next repetition.  After you have practiced this exercise, remove the arm support and complete the exercise in standing as well as sitting position. Repeat __________ times. Complete this  exercise __________ times per day.  STRENGTH - Shoulder Abductors, Isometric  With good  posture, stand or sit about 4-6 inches from a wall, with your right / left side facing the wall.  Bend your right / left elbow. Gently press your right / left elbow into the wall. Increase the pressure gradually, until you are pressing as hard as you can, without shrugging your shoulder or increasing any shoulder discomfort.  Hold for __________ seconds.  Release the tension slowly. Relax your shoulder muscles completely before you begin the next repetition. Repeat __________ times. Complete this exercise __________ times per day.  STRENGTH - External Rotators, Isometric  Keep your right / left elbow at your side and bend it 90 degrees.  Step into a door frame so that the outside of your right / left wrist can press against the door frame without your upper arm leaving your side.  Gently press your right / left wrist into the door frame, as if you were trying to swing the back of your hand away from your stomach. Gradually increase the tension, until you are pressing as hard as you can, without shrugging your shoulder or increasing any shoulder discomfort.  Hold for __________ seconds.  Release the tension slowly. Relax your shoulder muscles completely before you begin the next repetition. Repeat __________ times. Complete this exercise __________ times per day.  STRENGTH - Supraspinatus   Stand or sit with good posture. Grasp a __________ weight, or an exercise band or tubing, so that your hand is "thumbs-up," like you are shaking hands.  Slowly lift your right / left arm in a "V" away from your thigh, diagonally into the space between your side and straight ahead. Lift your hand to shoulder height or as far as you can, without increasing any shoulder pain. At first, many people do not lift their hands above shoulder height.  Avoid shrugging your right / left shoulder as your arm rises, by keeping  your shoulder blade tucked down and toward your mid-back spine.  Hold for __________ seconds. Control the descent of your hand, as you slowly return to your starting position. Repeat __________ times. Complete this exercise __________ times per day.  STRENGTH - External Rotators  Secure a rubber exercise band or tubing to a fixed object (table, pole) so that it is at the same height as your right / left elbow when you are standing or sitting on a firm surface.  Stand or sit so that the secured exercise band is at your uninjured side.  Bend your right / left elbow 90 degrees. Place a folded towel or small pillow under your right / left arm, so that your elbow is a few inches away from your side.  Keeping the tension on the exercise band, pull it away from your body, as if pivoting on your elbow. Be sure to keep your body steady, so that the movement is coming only from your rotating shoulder.  Hold for __________ seconds. Release the tension in a controlled manner, as you return to the starting position. Repeat __________ times. Complete this exercise __________ times per day.  STRENGTH - Internal Rotators   Secure a rubber exercise band or tubing to a fixed object (table, pole) so that it is at the same height as your right / left elbow when you are standing or sitting on a firm surface.  Stand or sit so that the secured exercise band is at your right / left side.  Bend your elbow 90 degrees. Place a folded towel or small pillow under your right / left arm  so that your elbow is a few inches away from your side.  Keeping the tension on the exercise band, pull it across your body, toward your stomach. Be sure to keep your body steady, so that the movement is coming only from your rotating shoulder.  Hold for __________ seconds. Release the tension in a controlled manner, as you return to the starting position. Repeat __________ times. Complete this exercise __________ times per day.    STRENGTH - Scapular Protractors, Standing   Stand arms length away from a wall. Place your hands on the wall, keeping your elbows straight.  Begin by dropping your shoulder blades down and toward your mid-back spine.  To strengthen your protractors, keep your shoulder blades down, but slide them forward on your rib cage. It will feel as if you are lifting the back of your rib cage away from the wall. This is a subtle motion and can be challenging to complete. Ask your caregiver for further instruction, if you are not sure you are doing the exercise correctly.  Hold for __________ seconds. Slowly return to the starting position, resting the muscles completely before starting the next repetition. Repeat __________ times. Complete this exercise __________ times per day. STRENGTH - Scapular Protractors, Supine  Lie on your back on a firm surface. Extend your right / left arm straight into the air while holding a __________ weight in your hand.  Keeping your head and back in place, lift your shoulder off the floor.  Hold for __________ seconds. Slowly return to the starting position, and allow your muscles to relax completely before starting the next repetition. Repeat __________ times. Complete this exercise __________ times per day. STRENGTH - Scapular Protractors, Quadruped  Get onto your hands and knees, with your shoulders directly over your hands (or as close as you can be, comfortably).  Keeping your elbows locked, lift the back of your rib cage up into your shoulder blades, so your mid-back rounds out. Keep your neck muscles relaxed.  Hold this position for __________ seconds. Slowly return to the starting position and allow your muscles to relax completely before starting the next repetition. Repeat __________ times. Complete this exercise __________ times per day.  STRENGTH - Scapular Retractors  Secure a rubber exercise band or tubing to a fixed object (table, pole), so that it is  at the height of your shoulders when you are either standing, or sitting on a firm armless chair.  With a palm down grip, grasp an end of the band in each hand. Straighten your elbows and lift your hands straight in front of you, at shoulder height. Step back, away from the secured end of the band, until it becomes tense.  Squeezing your shoulder blades together, draw your elbows back toward your sides, as you bend them. Keep your upper arms lifted away from your body throughout the exercise.  Hold for __________ seconds. Slowly ease the tension on the band, as you reverse the directions and return to the starting position. Repeat __________ times. Complete this exercise __________ times per day. STRENGTH - Shoulder Extensors   Secure a rubber exercise band or tubing to a fixed object (table, pole) so that it is at the height of your shoulders when you are either standing, or sitting on a firm armless chair.  With a thumbs-up grip, grasp an end of the band in each hand. Straighten your elbows and lift your hands straight in front of you, at shoulder height. Step back, away from the  secured end of the band, until it becomes tense.  Squeezing your shoulder blades together, pull your hands down to the sides of your thighs. Do not allow your hands to go behind you.  Hold for __________ seconds. Slowly ease the tension on the band, as you reverse the directions and return to the starting position. Repeat __________ times. Complete this exercise __________ times per day.  STRENGTH - Scapular Retractors and External Rotators   Secure a rubber exercise band or tubing to a fixed object (table, pole) so that it is at the height as your shoulders, when you are either standing, or sitting on a firm armless chair.  With a palm down grip, grasp an end of the band in each hand. Bend your elbows 90 degrees and lift your elbows to shoulder height, at your sides. Step back, away from the secured end of the band,  until it becomes tense.  Squeezing your shoulder blades together, rotate your shoulders so that your upper arms and elbows remain stationary, but your fists travel upward to head height.  Hold for __________ seconds. Slowly ease the tension on the band, as you reverse the directions and return to the starting position. Repeat __________ times. Complete this exercise __________ times per day.  STRENGTH - Scapular Retractors and External Rotators, Rowing   Secure a rubber exercise band or tubing to a fixed object (table, pole) so that it is at the height of your shoulders, when you are either standing, or sitting on a firm armless chair.  With a palm down grip, grasp an end of the band in each hand. Straighten your elbows and lift your hands straight in front of you, at shoulder height. Step back, away from the secured end of the band, until it becomes tense.  Step 1: Squeeze your shoulder blades together. Bending your elbows, draw your hands to your chest, as if you are rowing a boat. At the end of this motion, your hands and elbow should be at shoulder height and your elbows should be out to your sides.  Step 2: Rotate your shoulders, to raise your hands above your head. Your forearms should be vertical and your upper arms should be horizontal.  Hold for __________ seconds. Slowly ease the tension on the band, as you reverse the directions and return to the starting position. Repeat __________ times. Complete this exercise __________ times per day.  STRENGTH - Scapular Depressors  Find a sturdy chair without wheels, such as a dining room chair.  Keeping your feet on the floor, and your hands on the chair arms, lift your bottom up from the seat, and lock your elbows.  Keeping your elbows straight, allow gravity to pull your body weight down. Your shoulders will rise toward your ears.  Raise your body against gravity by drawing your shoulder blades down your back, shortening the distance  between your shoulders and ears. Although your feet should always maintain contact with the floor, your feet should progressively support less body weight, as you get stronger.  Hold for __________ seconds. In a controlled and slow manner, lower your body weight to begin the next repetition. Repeat __________ times. Complete this exercise __________ times per day.    This information is not intended to replace advice given to you by your health care provider. Make sure you discuss any questions you have with your health care provider.   Document Released: 12/09/2005 Document Revised: 12/30/2014 Document Reviewed: 03/23/2009 Elsevier Interactive Patient Education [employer]  IF you received an x-ray today, you will receive an invoice from [facility]. Please contact [facility] at [phone] with questions or concerns regarding your invoice.   IF you received labwork today, you will receive an invoice from [facility]. Please contact [facility] at [phone] with questions or concerns regarding your invoice.   Our billing staff will not be able to assist you with questions regarding bills from these companies.  You will be contacted with the lab results as soon as they are available. The fastest way to get your results is to activate your My Chart account. Instructions are located on the last page of this paperwork. If you have not heard from us regarding the results in 2 weeks, please contact this office.        I personally performed the services described in this documentation, which was scribed in my presence. The recorded information has been reviewed and considered, and addended by me as needed.   Signed,   Meredith StaggersJeffrey Caisley Baxendale, MD [facility].  10/12/16 12:32 PM

## 2016-10-26 ENCOUNTER — Ambulatory Visit (INDEPENDENT_AMBULATORY_CARE_PROVIDER_SITE_OTHER): Payer: PRIVATE HEALTH INSURANCE | Admitting: Osteopathic Medicine

## 2016-10-26 VITALS — BP 104/68 | HR 96 | Temp 98.5°F | Resp 18 | Ht 58.75 in | Wt 106.0 lb

## 2016-10-26 DIAGNOSIS — M67911 Unspecified disorder of synovium and tendon, right shoulder: Secondary | ICD-10-CM | POA: Diagnosis not present

## 2016-10-26 DIAGNOSIS — M25511 Pain in right shoulder: Secondary | ICD-10-CM | POA: Diagnosis not present

## 2016-10-26 NOTE — Patient Instructions (Addendum)
We performed a shoulder injection today. This should hopefully help your pain over the next few weeks. If it doesn't please call us so we can place referral for orthopedics. Please limit lifting at work to keep from exacerbating your condition.     IF you received an x-ray today, you will receive an invoice from [facility]. Please contact [facility] at [phone] with questions or concerns regarding your invoice.   IF you received labwork today, you will receive an invoice from [facility]. Please contact [facility] at [phone] with questions or concerns regarding your invoice.   Our billing staff will not be able to assist you with questions regarding bills from these companies.  You will be contacted with the lab results as soon as they are available. The fastest way to get your results is to activate your My Chart account. Instructions are located on the last page of this paperwork. If you have not heard from us regarding the results in 2 weeks, please contact this office.

## 2016-10-26 NOTE — Progress Notes (Signed)
HPI: Sydney [name] is a 50 y.o. female  who [facility] today, 10/26/16,  for chief complaint of:  Chief Complaint  Patient presents with  . Follow-up    RIGHT SHOULDER     . Context: 10/12/16 was seen for R shoulder pain x1 month by Dr Neva SeatGreene, Dx as rotator cuff tendinopathy, Rx Meloxicam and home exercises, here for followup . Location: R shoulder, "back of shoulder joint"  . Quality: difficulty lifting and twisting  . Duration: >1 month . Modifying factors: Meloxicam and home exercises not helping    Past medical, surgical, social and family history reviewed: History reviewed. No pertinent past medical history. History reviewed. No pertinent surgical history. Social History  Substance Use Topics  . Smoking status: Never Smoker  . Smokeless tobacco: Never Used  . Alcohol use No   History reviewed. No pertinent family history.   Current medication list and allergy/intolerance information reviewed:   Current Outpatient Prescriptions  Medication Sig Dispense Refill  . meloxicam (MOBIC) 7.5 MG tablet Take 1 tablet (7.5 mg total) by mouth daily. 30 tablet 0   No current facility-administered medications for this visit.    No Known Allergies    Review of Systems:  Constitutional:  No  fever, no chills, No recent illness,  HEENT: No  headache  Cardiac: No  chest pain  Respiratory:  No  shortness of breath  Musculoskeletal: No new myalgia/arthralgia  Skin: No  Rash   Exam:  BP 104/68 (BP Location: Right Arm, Patient Position: Sitting, Cuff Size: Small)   Pulse 96   Temp 98.5 F (36.9 C) (Oral)   Resp 18   Ht 4' 10.75" (1.492 m)   Wt 106 lb (48.1 kg)   LMP 09/28/2016 (Approximate)   SpO2 100%   BMI 21.59 kg/m   Constitutional: VS see above. General Appearance: alert, well-developed, well-nourished, NADs  Respiratory: Normal respiratory effort. .   Musculoskeletal: Gait normal. No clubbing/cyanosis of digits.  Shoulder: Neers positive for impingement, Cross arm negative, Apley scratch positive, Drop arm negative, Empty can some pain with posterior rotation but not anterior rotation, normal passive ROM  Skin: warm, dry, intact. No rash/ulcer.   PRE-OP DIAGNOSIS: shoulder pain likely impingement versus tendinitis POST-OP DIAGNOSIS: Same  PROCEDURE: R subacromial shoulder joint  injection  Performing Physician: Lyn HollingsheadAlexander Dose: Xylocaine 2% x4 mL, Kenalog Steroid 40mg /mL x301mL  Procedure: Risks and benefits discussed including but not limited to infection, steroid reaction pain, ineffective relief of current pain, injury to surrounding tissues, bleeding -  informed consent obtained. obtainedThe area was prepped in the usual sterile manner. The needle was inserted into the affected area and the steroid/anesthetic was injected. There were no complications during this procedure.  Followup: The patient tolerated the procedure well without complications. Standard post-procedure care is explained and return precautions are given.   ASSESSMENT/PLAN:   Tendinopathy of rotator cuff, right  Pain in joint of right shoulder  Patient Instructions   We performed a shoulder injection today. This should hopefully help your pain over the next few weeks. If it doesn't please call us so we can place referral for orthopedics. Please limit lifting at work to keep from exacerbating your condition.      Visit summary with medication list and pertinent instructions was printed for patient to review. All questions at time of visit were answered - patient instructed to contact office with any additional concerns. ER/RTC precautions were reviewed with the patient. Follow-up plan:  Return if symptoms worsen or fail to improve - call us for referral or come in for a visit .

## 2016-10-31 ENCOUNTER — Emergency Department (HOSPITAL_COMMUNITY): Payer: PRIVATE HEALTH INSURANCE

## 2016-10-31 ENCOUNTER — Ambulatory Visit: Payer: PRIVATE HEALTH INSURANCE

## 2016-10-31 ENCOUNTER — Encounter (HOSPITAL_COMMUNITY): Payer: Self-pay

## 2016-10-31 ENCOUNTER — Emergency Department (HOSPITAL_COMMUNITY)
Admission: EM | Admit: 2016-10-31 | Discharge: 2016-11-01 | Disposition: A | Discharge status: Home / Self Care | Payer: PRIVATE HEALTH INSURANCE | Attending: Emergency Medicine | Admitting: Emergency Medicine

## 2016-10-31 DIAGNOSIS — Z79899 Other long term (current) drug therapy: Secondary | ICD-10-CM | POA: Insufficient documentation

## 2016-10-31 DIAGNOSIS — R197 Diarrhea, unspecified: Secondary | ICD-10-CM | POA: Insufficient documentation

## 2016-10-31 DIAGNOSIS — L02511 Cutaneous abscess of right hand: Secondary | ICD-10-CM | POA: Diagnosis not present

## 2016-10-31 DIAGNOSIS — R112 Nausea with vomiting, unspecified: Secondary | ICD-10-CM | POA: Insufficient documentation

## 2016-10-31 DIAGNOSIS — Z791 Long term (current) use of non-steroidal anti-inflammatories (NSAID): Secondary | ICD-10-CM | POA: Diagnosis not present

## 2016-10-31 LAB — URINALYSIS, ROUTINE W REFLEX MICROSCOPIC
Bilirubin Urine: NEGATIVE
GLUCOSE, UA: NEGATIVE mg/dL
Nitrite: NEGATIVE
PROTEIN: NEGATIVE mg/dL
Specific Gravity, Urine: 1.029 (ref 1.005–1.030)
pH: 6 (ref 5.0–8.0)

## 2016-10-31 LAB — COMPREHENSIVE METABOLIC PANEL
ALT: 18 U/L (ref 14–54)
AST: 20 U/L (ref 15–41)
Albumin: 4.3 g/dL (ref 3.5–5.0)
Alkaline Phosphatase: 61 U/L (ref 38–126)
Anion gap: 9 (ref 5–15)
BUN: 18 mg/dL (ref 6–20)
CHLORIDE: 102 mmol/L (ref 101–111)
CO2: 26 mmol/L (ref 22–32)
CREATININE: 0.8 mg/dL (ref 0.44–1.00)
Calcium: 9.9 mg/dL (ref 8.9–10.3)
GFR calc Af Amer: >60 mL/min (ref >60)
Glucose, Bld: 106 mg/dL — ABNORMAL HIGH (ref 65–99)
Potassium: 4.1 mmol/L (ref 3.5–5.1)
SODIUM: 137 mmol/L (ref 135–145)
Total Bilirubin: 1.1 mg/dL (ref 0.3–1.2)
Total Protein: 8 g/dL (ref 6.5–8.1)

## 2016-10-31 LAB — CBC
HCT: 42.3 % (ref 36.0–46.0)
Hemoglobin: 14.1 g/dL (ref 12.0–15.0)
MCH: 29.1 pg (ref 26.0–34.0)
MCHC: 33.3 g/dL (ref 30.0–36.0)
MCV: 87.4 fL (ref 78.0–100.0)
PLATELETS: 384 10*3/uL (ref 150–400)
RBC: 4.84 MIL/uL (ref 3.87–5.11)
RDW: 14.4 % (ref 11.5–15.5)
WBC: 11.7 10*3/uL — ABNORMAL HIGH (ref 4.0–10.5)

## 2016-10-31 LAB — URINE MICROSCOPIC-ADD ON

## 2016-10-31 LAB — I-STAT BETA HCG BLOOD, ED (MC, WL, AP ONLY): I-stat hCG, quantitative: <5 m[IU]/mL (ref <5)

## 2016-10-31 LAB — LIPASE, BLOOD: LIPASE: 63 U/L — AB (ref 11–51)

## 2016-10-31 MED ORDER — LIDOCAINE HCL 1 % IJ SOLN
INTRAMUSCULAR | Status: AC
  Administered 2016-11-01
  Filled 2016-10-31: qty 20

## 2016-10-31 MED ORDER — ONDANSETRON HCL 4 MG/2ML IJ SOLN
4.0000 mg | Freq: Once | INTRAMUSCULAR | Status: AC
  Administered 2016-10-31: 4 mg via INTRAVENOUS
  Filled 2016-10-31: qty 2

## 2016-10-31 MED ORDER — SODIUM CHLORIDE 0.9 % IV BOLUS (SEPSIS)
1000.0000 mL | Freq: Once | INTRAVENOUS | Status: AC
  Administered 2016-10-31: 1000 mL via INTRAVENOUS

## 2016-10-31 NOTE — ED Triage Notes (Signed)
Pt c/o emesis and diarrhea starting yesterday and R index finger injury r/t fall x 10 days.  Pain score 8/10.  Pt reports taking Tylenol w/o relief.  Tip of finger is red and swelling noted.

## 2016-10-31 NOTE — ED Provider Notes (Signed)
[facility]-EMERGENCY DEPT Provider Note   CSN: [id number] Arrival date & time: 10/31/16  1758     History   Chief Complaint Chief Complaint  Patient presents with  . Emesis  . Finger Injury    HPI Sydney [name] is a 50 y.o. female.  Patient is 50 yo F with no significant PMH, presenting with chief complaint of vomiting and diarrhea starting yesterday, and pain and swelling to right index finger x 10 days. She denies any fevers, abdominal pain, blood in stools, or dysuria. Vomiting has been constant, with last episode of vomiting just PTA. She denies any hematemesis, chest pain, or shortness of breath. She tried drinking orange juice and ginger ale for relief, but it did not help. Also reports pain and swelling to her right index finger after she scraped it on concrete while trying to save her cell phone from falling. Rates the pain 10/10.      History reviewed. No pertinent past medical history.  Patient Active Problem List   Diagnosis Date Noted  . 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 TUBAL LIGATION      OB History    No data available       Home Medications    Prior to Admission medications   Medication Sig Start Date End Date Taking? Authorizing Provider  acetaminophen (TYLENOL) 500 MG tablet Take 1,000 mg by mouth every 6 (six) hours as needed (pain).   Yes Historical Provider, MD  meloxicam (MOBIC) 7.5 MG tablet Take 1 tablet (7.5 mg total) by mouth daily. 10/12/16  Yes Shade FloodJeffrey R Greene, MD    Family History History reviewed. No pertinent family history.  Social History Social History  Substance Use Topics  . Smoking status: Never Smoker  . Smokeless tobacco: Never Used  . Alcohol use No     Allergies   Patient has no known allergies.   Review of Systems Review of Systems  Constitutional: Negative for chills and fever.  HENT: Negative for ear pain and sore throat.   Eyes: Negative  for pain and visual disturbance.  Respiratory: Negative for cough and shortness of breath.   Cardiovascular: Negative for chest pain, palpitations and leg swelling.  Gastrointestinal: Positive for diarrhea, nausea and vomiting. Negative for abdominal pain and blood in stool.  Genitourinary: Negative for dysuria, flank pain and hematuria.  Musculoskeletal: Positive for joint swelling (DIP right index finger). Negative for back pain and neck pain.  Skin: Positive for wound (right index finger).  Neurological: Negative for dizziness, seizures, syncope, weakness, numbness and headaches.     Physical Exam Updated Vital Signs BP 106/68 (BP Location: Left Arm)   Pulse 75   Temp 99 F (37.2 C) (Oral)   Resp 15   Wt 45.8 kg   LMP 09/28/2016 (Approximate)   SpO2 99%   BMI 20.57 kg/m   Physical Exam  Constitutional: She appears well-developed and well-nourished. No distress.  Lying comfortably in bed, not actively vomiting.  HENT:  Head: Normocephalic and atraumatic.  Mouth/Throat: Oropharynx is clear and moist.  Eyes: Conjunctivae are normal.  Neck: Normal range of motion.  Cardiovascular: Normal rate, regular rhythm, normal heart sounds and intact distal pulses.   Strong radial pulses bilaterally.  Pulmonary/Chest: Effort normal and breath sounds normal. No respiratory distress.  Abdominal: Soft. Bowel sounds are normal. She exhibits no distension. There is no guarding.  Abdomen NTTP. Neg Murphy's, neg McBurney's, no CVA tenderness.  Musculoskeletal: Normal range of motion. She exhibits no edema or tenderness.  FROM right hand and right index finger, with no significant swelling noted to DIP or PIP. No obvious bony deformity, crepitus, or dislocation of right index finger.  Neurological: She is alert. No sensory deficit.  Skin: Skin is warm and dry.  Medial aspect of DIP right index finger erythematous, tender, swollen, with mild fluctuance. No obvious involvement of cuticle or nail  bed.  Psychiatric: She has a normal mood and affect.  Nursing note and vitals reviewed.    ED Treatments / Results  Labs (all labs ordered are listed, but only abnormal results are displayed) Labs Reviewed  LIPASE, BLOOD - Abnormal; Notable for the following:       Result Value   Lipase 63 (*)    All other components within normal limits  COMPREHENSIVE METABOLIC PANEL - Abnormal; Notable for the following:    Glucose, Bld 106 (*)    All other components within normal limits  CBC - Abnormal; Notable for the following:    WBC 11.7 (*)    All other components within normal limits  URINALYSIS, ROUTINE W REFLEX MICROSCOPIC (NOT AT [facility])  I-STAT BETA HCG BLOOD, ED ([facility], [facility], [facility] ONLY)    EKG  EKG Interpretation None       Radiology Dg Finger Index Right  Result Date: 10/31/2016 CLINICAL DATA:  50 year old female injured right index finger 1 and half weeks ago. Initial encounter. EXAM: RIGHT INDEX FINGER 2+V COMPARISON:  None. FINDINGS: There is no evidence of fracture or dislocation. There is no evidence of arthropathy or other focal bone abnormality. Soft tissues are unremarkable. IMPRESSION: Negative. Electronically Signed   By: Lacy Duverney M.D.   On: 10/31/2016 19:10    Procedures .Marland KitchenIncision and Drainage Date/Time: 11/01/2016 11:09 AM Performed by: Lajean Silvius F Authorized by: Lajean Silvius F   Consent:    Consent obtained:  Verbal   Consent given by:  Patient   Risks discussed:  Bleeding, incomplete drainage, pain and infection   Alternatives discussed:  No treatment Location:    Type:  Abscess   Size:  0.5 cm   Location: Medial aspect of DIP right index finger. Pre-procedure details:    Skin preparation:  Betadine Anesthesia (see MAR for exact dosages):    Anesthesia method:  Local infiltration   Local anesthetic:  Lidocaine 1% w/o epi Procedure type:    Complexity:  Simple Procedure details:    Incision types:  Stab incision   Incision  depth:  Dermal   Scalpel blade:  11   Wound management:  Irrigated with saline and extensive cleaning   Drainage:  Bloody and purulent   Drainage amount:  Scant   Wound treatment:  Wound left open   Packing materials:  None Post-procedure details:    Patient tolerance of procedure:  Tolerated well, no immediate complications   (including critical care time)  Medications Ordered in ED Medications - No data to display   Initial Impression / Assessment and Plan / ED Course  I have reviewed the triage vital signs and the nursing notes.  Pertinent labs & imaging results that were available during my care of the patient were reviewed by me and considered in my medical decision making (see chart for details).  Clinical Course    Patient is 50 yo F presenting with vomiting and mild diarrhea, and pain and swelling to right index finger. VSS and abdomen soft and NTTP.  Given IVF and Zofran which resolved nausea. CBC, CMP, lipase unremarkable. Urinalysis shows moderate leukocytes, but patient is asymptomatic of UTI. Urine sent for culture. X-ray right finger negative for fracture. Small abscess noted to medial aspect of DIP right index finger. I&D performed with scant purulent drainage. Wound care and clean, sterile dressing applied. Given prescription for Keflex for antibiotic prophylaxis, and advised to f/u with hand surgeon tomorrow for reevaluation. Also given short supply of Norco and ibuprofen for finger pain, and Zofran ODT as needed for relief of nausea/vomiting. Stable for d/c home with strict return precautions for pain, redness, and swelling of finger, or fever, abdominal pain, and persistent vomiting.  Final Clinical Impressions(s) / ED Diagnoses   Final diagnoses:  Nausea and vomiting, intractability of vomiting not specified, unspecified vomiting type  Abscess of right index finger    New Prescriptions Discharge Medication List as of 11/01/2016 12:10 AM    START taking these  medications   Details  cephALEXin (KEFLEX) 500 MG capsule Take 1 capsule (500 mg total) by mouth 4 (four) times daily., Starting Fri 11/01/2016, Print    HYDROcodone-acetaminophen (NORCO/VICODIN) 5-325 MG tablet Take 1 tablet by mouth every 6 (six) hours as needed., Starting Fri 11/01/2016, Print    ibuprofen (ADVIL,MOTRIN) 400 MG tablet Take 1 tablet (400 mg total) by mouth every 6 (six) hours as needed., Starting Fri 11/01/2016, Print         Darreld Hoffer F de [place], [place] 11/01/16 1123    Derwood KaplanAnkit Nanavati, MD 11/01/16 1615

## 2016-10-31 NOTE — Progress Notes (Signed)
Patient listed as not having a pcp.  Patient reports she is usually seen at the [facility] [address].  [facility], she can be seen there for primary care.  EDCM also encouraged patient to call the phone number on the back of her insurnace card to assist he in finding a pcp who is within network.  Patient thankful for assistance.  No further EDCM needs at this time.

## 2016-11-01 MED ORDER — HYDROCODONE-ACETAMINOPHEN 5-325 MG PO TABS
1.0000 | ORAL_TABLET | Freq: Four times a day (QID) | ORAL | 0 refills | Status: DC | PRN
Start: 2016-11-01 — End: 2020-12-03

## 2016-11-01 MED ORDER — CEPHALEXIN 500 MG PO CAPS: 500.0000 mg | ORAL_CAPSULE | Freq: Four times a day (QID) | ORAL | 0 refills | Status: DC

## 2016-11-01 MED ORDER — IBUPROFEN 400 MG PO TABS: 400.0000 mg | ORAL_TABLET | Freq: Four times a day (QID) | ORAL | 0 refills | Status: DC | PRN

## 2016-11-01 NOTE — Discharge Instructions (Signed)
Please complete the full course of Keflex (antibiotics for your finger) and call to schedule follow up appointment with hand surgeon tomorrow. Also, please soak your hand in warm water with epsom salts, take ibuprofen for mild-moderate pain, and the prescription Norco for severe pain. A prescription for Zofran as also been provided to relieve your nausea/vomiting.

## 2016-11-02 ENCOUNTER — Ambulatory Visit: Payer: PRIVATE HEALTH INSURANCE

## 2016-11-02 LAB — URINE CULTURE

## 2017-03-28 IMAGING — DX DG SHOULDER 2+V*R*
3 series · 3 of 3 positions shown · non-contrast
Comparison: None.

CLINICAL DATA: Right shoulder pain

EXAM:
RIGHT SHOULDER - 2+ VIEW

[shoulder ap]
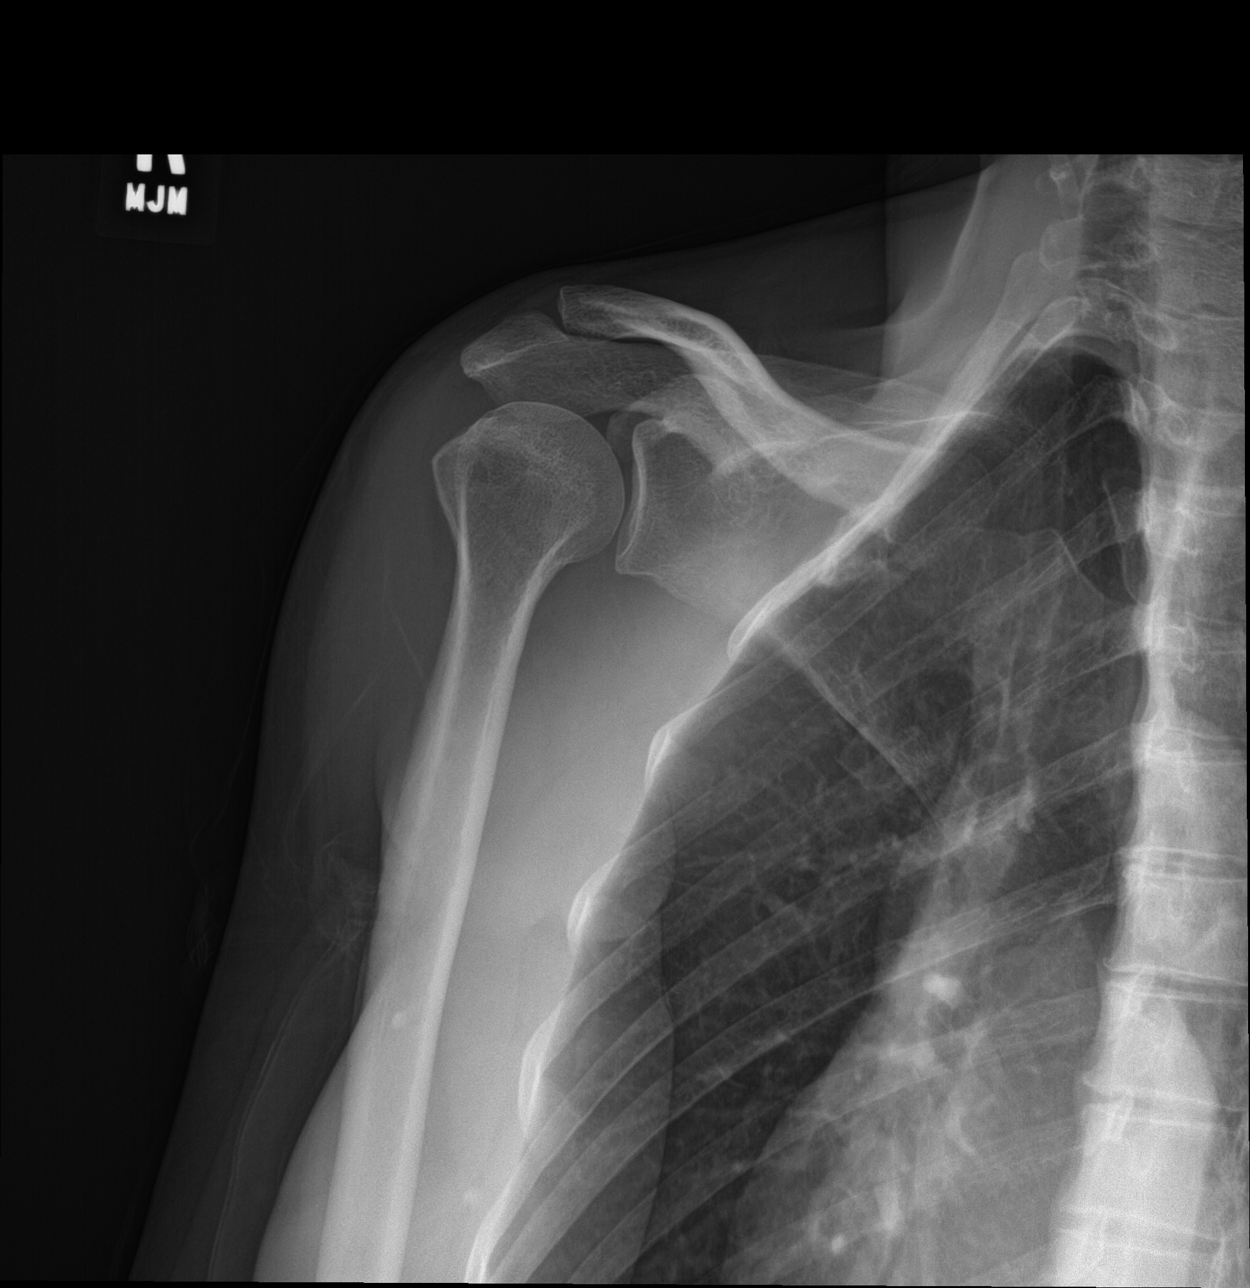

[shoulder y-view]
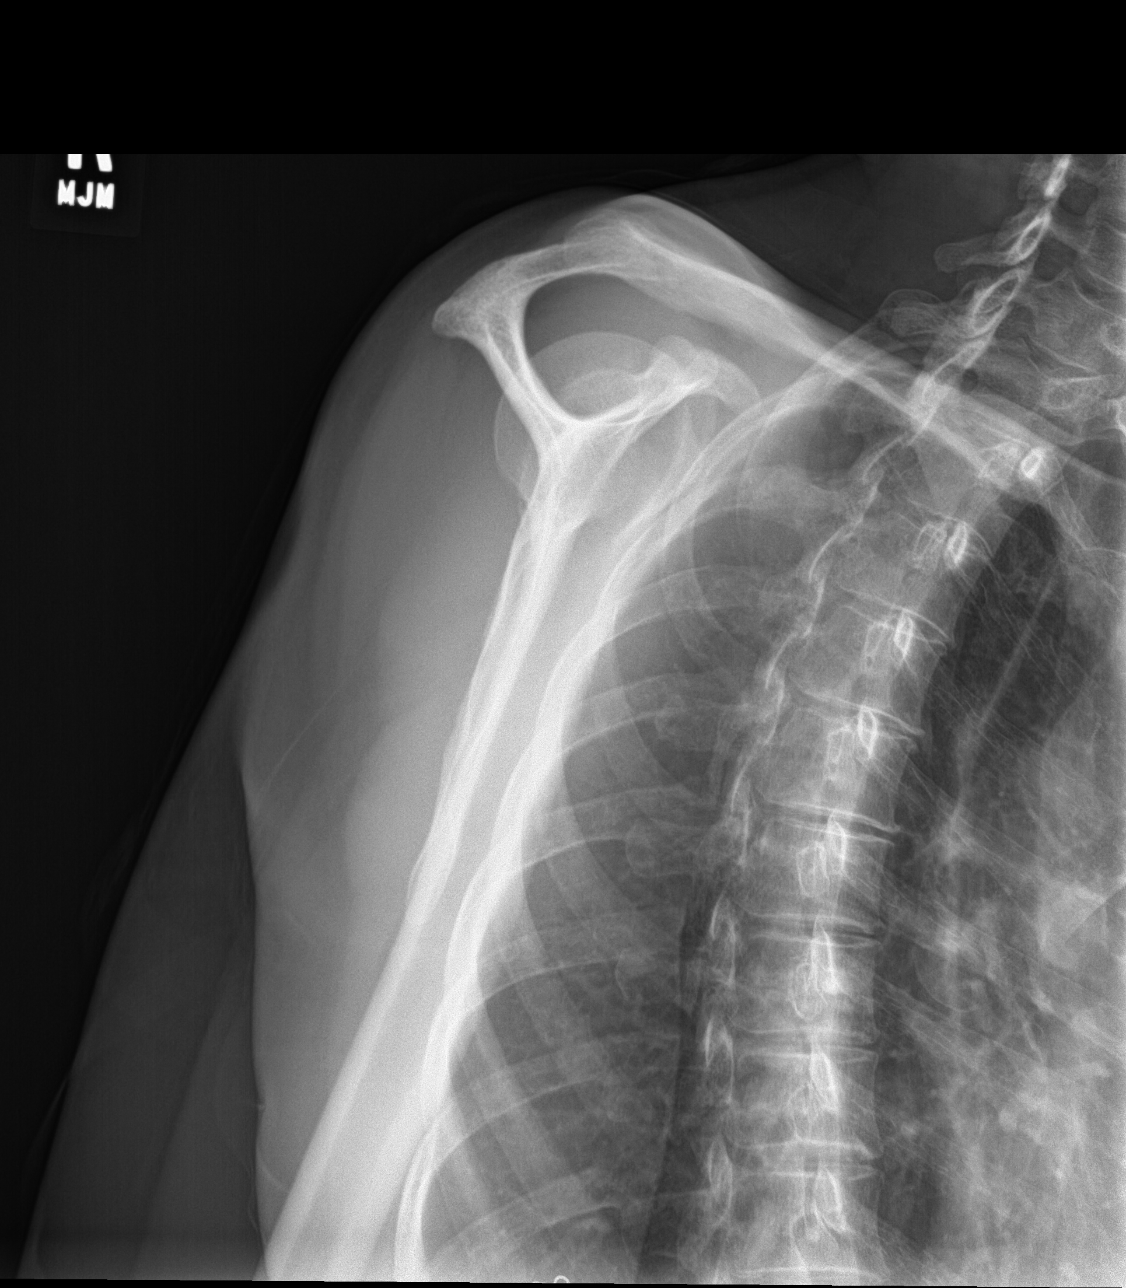

[shoulder axial]
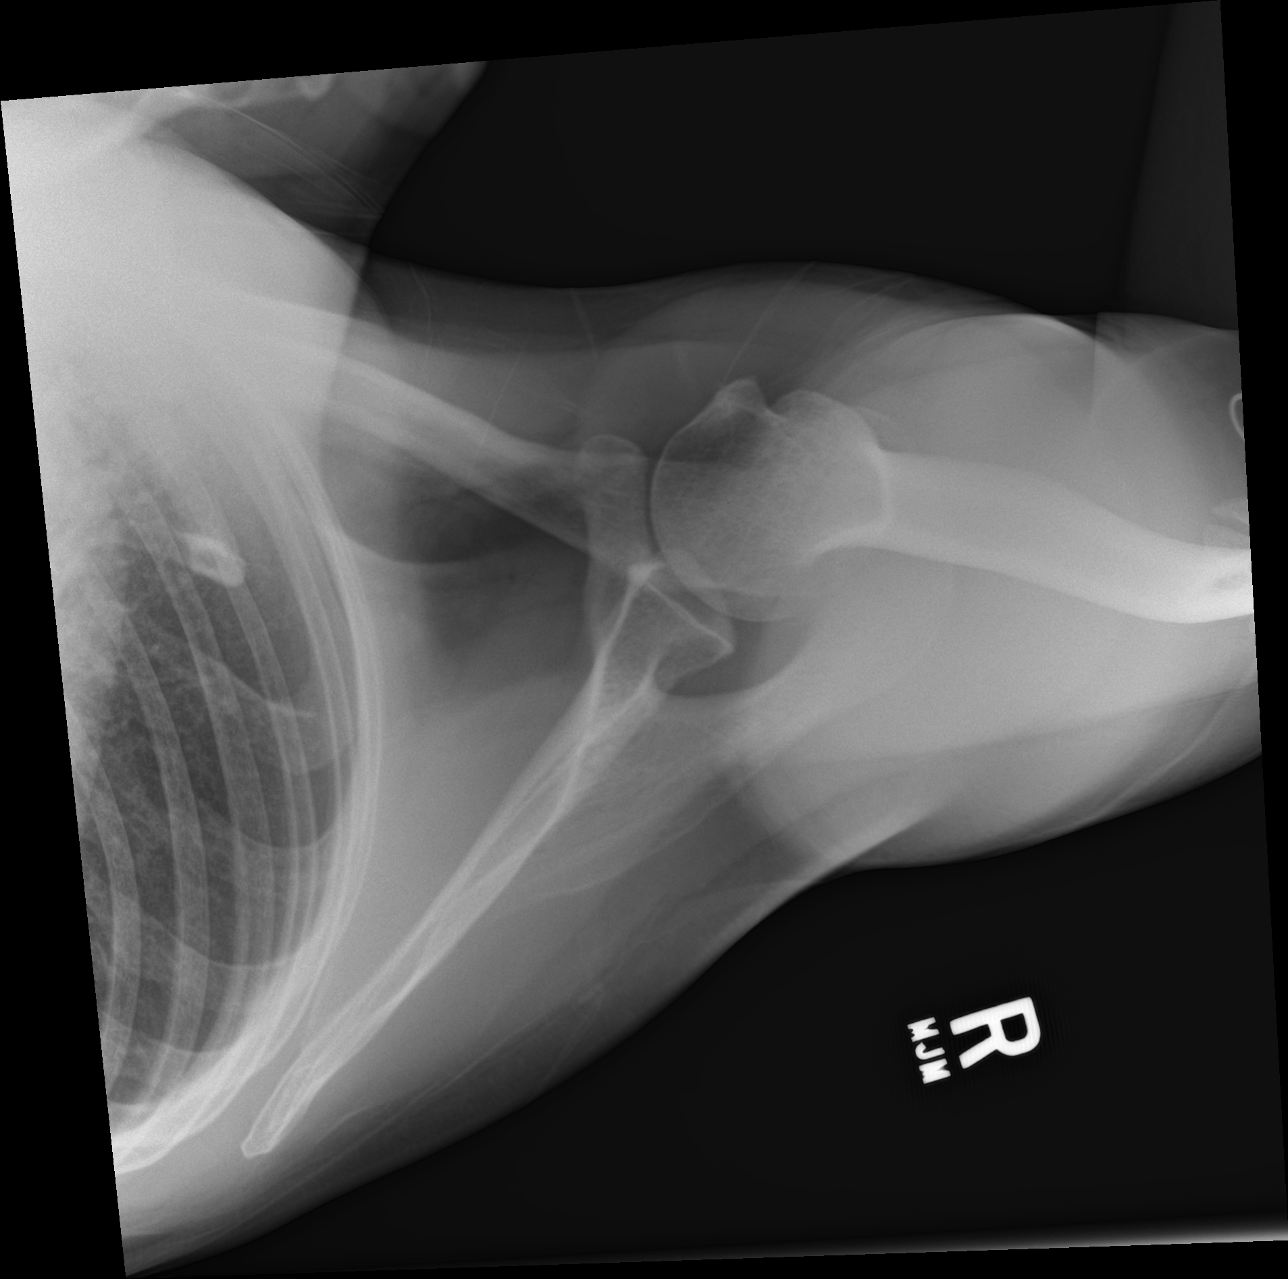

[3 of 3 positions shown; findings below may reference images not displayed]

FINDINGS: Three views of the right shoulder submitted. No acute fracture or
subluxation. No radiopaque foreign body.
IMPRESSION: Negative.

## 2017-04-16 IMAGING — CR DG FINGER INDEX 2+V*R*
3 series · 3 of 3 positions shown · non-contrast
Comparison: None.

CLINICAL DATA: 50-year-old female injured right index finger 1 and
half weeks ago. Initial encounter.

EXAM:
RIGHT INDEX FINGER 2+V

[x finger pa right]
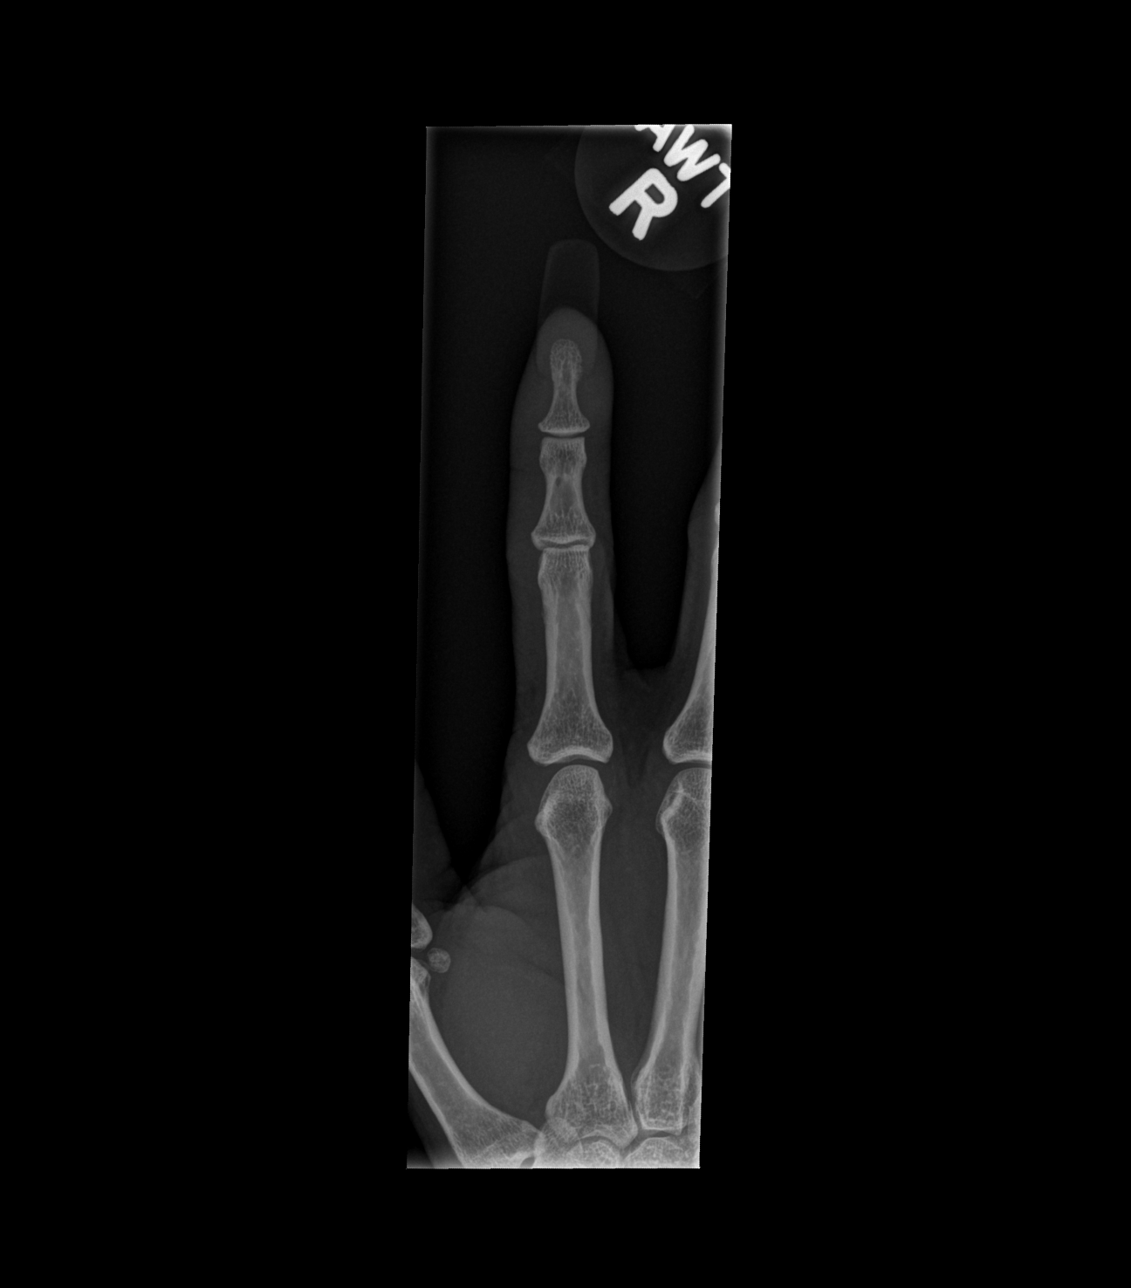

[x finger obl right]
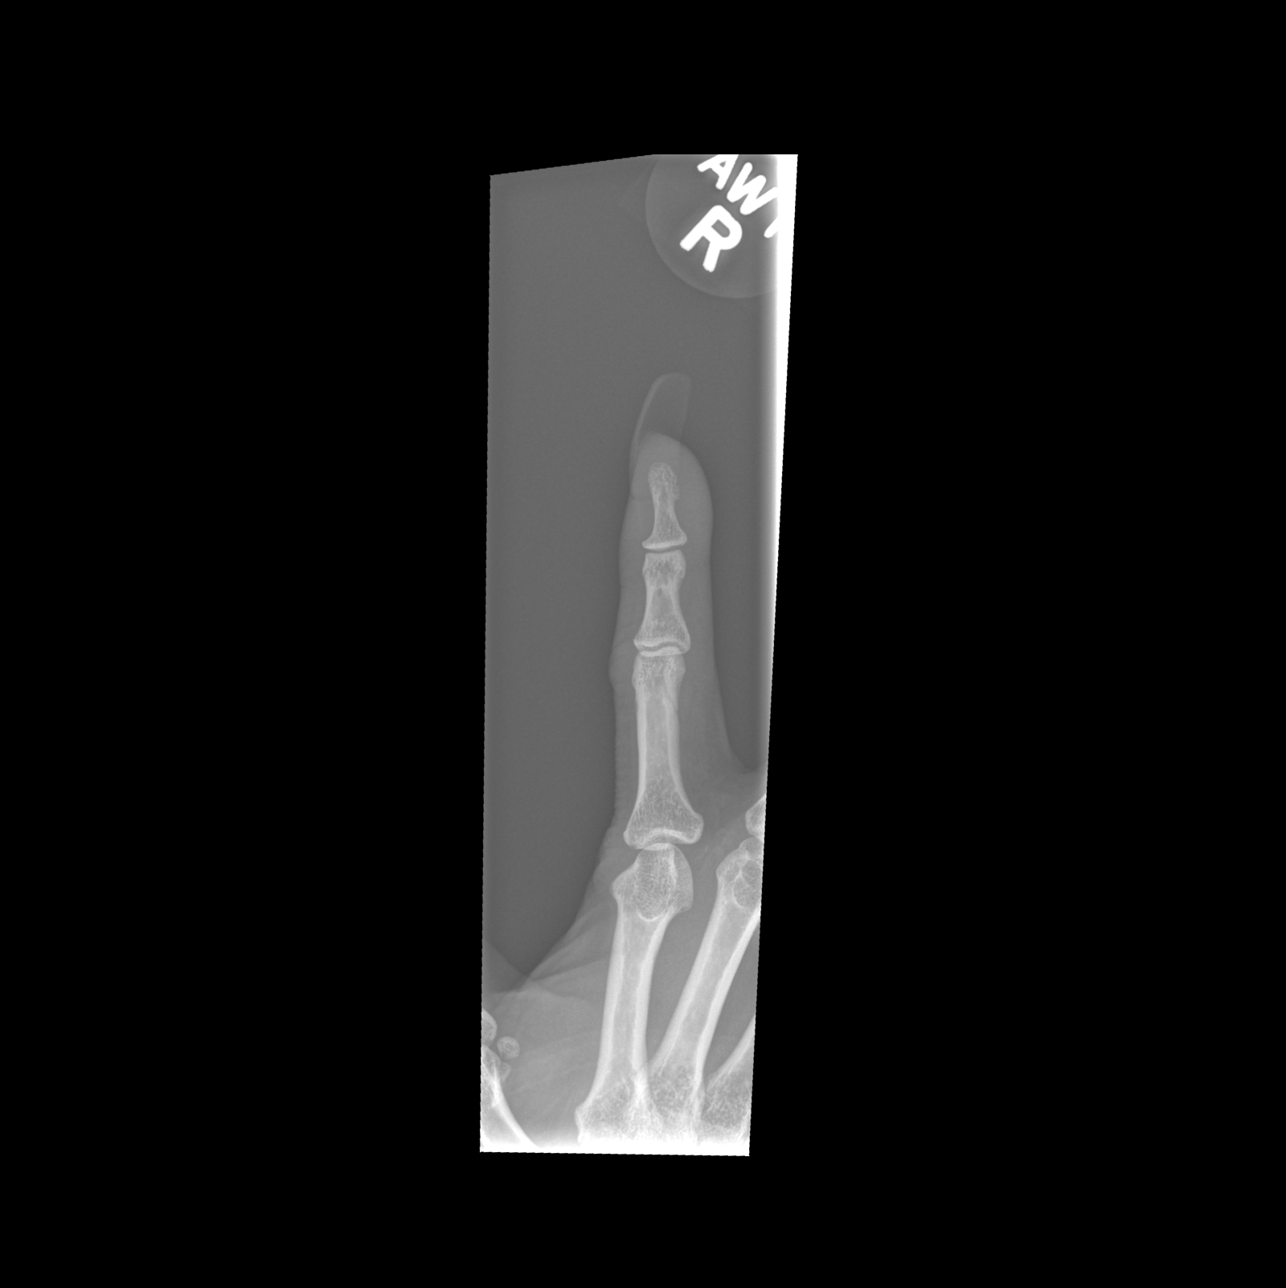

[x finger lat right]
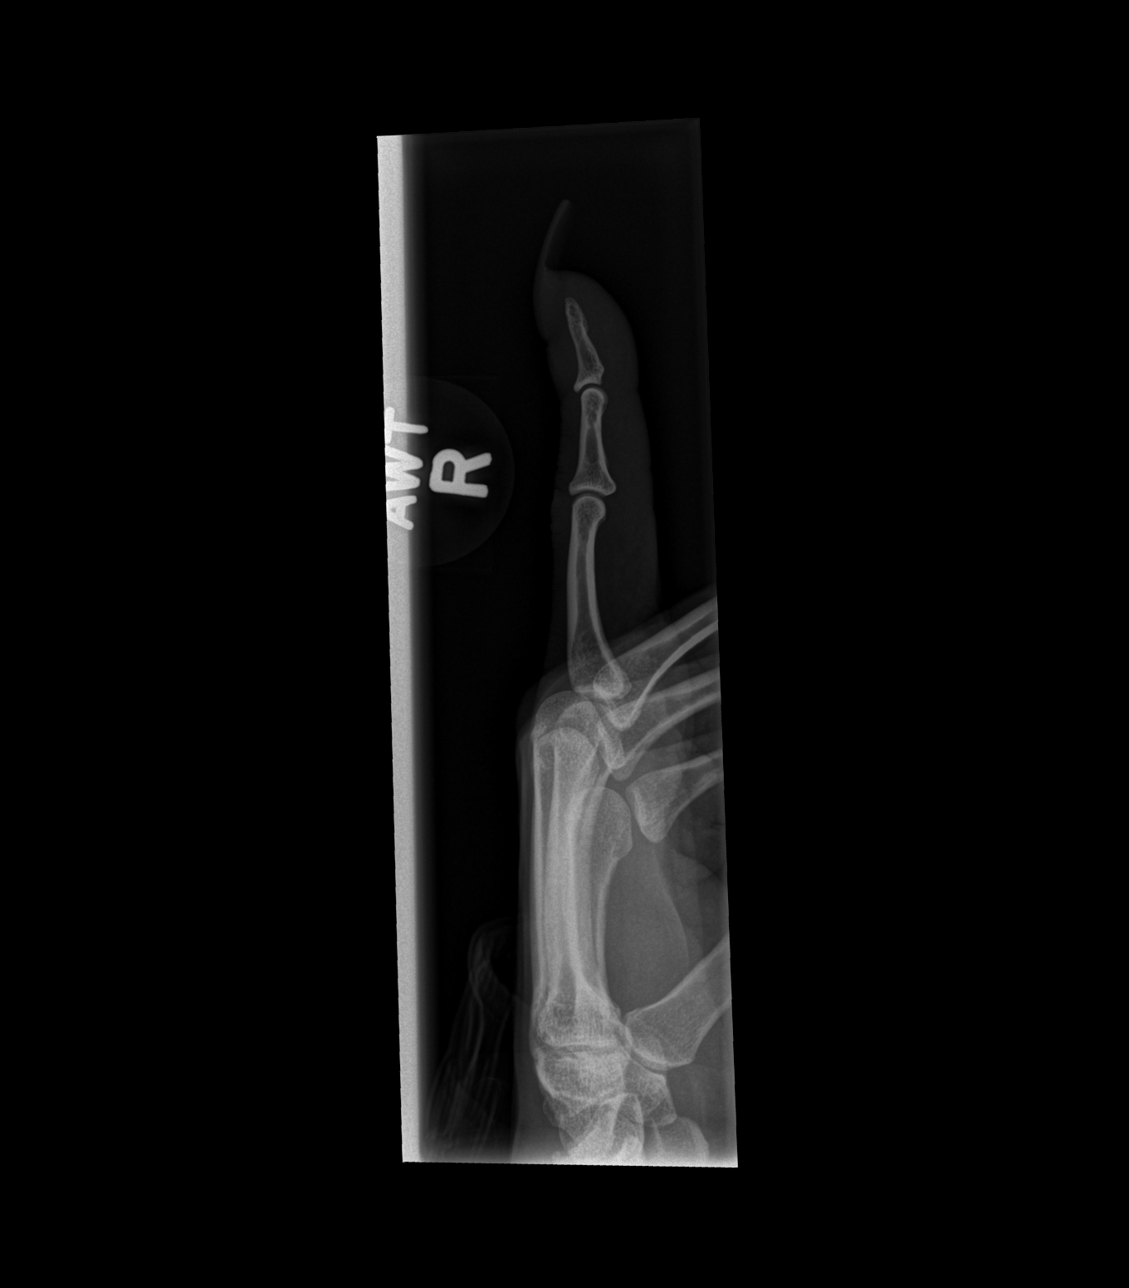

[3 of 3 positions shown; findings below may reference images not displayed]

FINDINGS: There is no evidence of fracture or dislocation. There is no
evidence of arthropathy or other focal bone abnormality. Soft
tissues are unremarkable.
IMPRESSION: Negative.

## 2018-06-22 ENCOUNTER — Ambulatory Visit (HOSPITAL_COMMUNITY)
Admission: EM | Admit: 2018-06-22 | Discharge: 2018-06-22 | Disposition: A | Discharge status: Home / Self Care | Payer: PRIVATE HEALTH INSURANCE | Attending: Family Medicine | Admitting: Family Medicine

## 2018-06-22 ENCOUNTER — Encounter (HOSPITAL_COMMUNITY): Payer: Self-pay | Admitting: Emergency Medicine

## 2018-06-22 ENCOUNTER — Other Ambulatory Visit: Payer: Self-pay

## 2018-06-22 DIAGNOSIS — L03012 Cellulitis of left finger: Secondary | ICD-10-CM

## 2018-06-22 DIAGNOSIS — L0291 Cutaneous abscess, unspecified: Secondary | ICD-10-CM

## 2018-06-22 MED ORDER — DOXYCYCLINE HYCLATE 100 MG PO CAPS: 100.0000 mg | ORAL_CAPSULE | Freq: Two times a day (BID) | ORAL | 0 refills | Status: DC

## 2018-06-22 NOTE — ED Provider Notes (Signed)
[facility]   [id number] 06/22/18 Arrival Time: 1548  SUBJECTIVE:  Sydney [name] is a 52 y.o. female who presents with a left middle finger bump that began 1 week ago.  Denies a precipitating event or trauma.  Localizes the bump to left middle finger.  Describes it as painful, red with purulent drainage.  Has tried "popping" the bump without relief.  Symptoms are made worse with finger flexion.  Denies similar symptoms in the past. Denies fever, chills, nausea, vomiting, erythema, redness, swollen glands, SOB, chest pain, abdominal pain, changes in bowel or bladder function.    ROS: As per HPI.  History reviewed. No pertinent past medical history. Past Surgical History:  Procedure Laterality Date  . TUBAL LIGATION     No Known Allergies No current facility-administered medications on file prior to encounter.    Current Outpatient Medications on File Prior to Encounter  Medication Sig Dispense Refill  . acetaminophen (TYLENOL) 500 MG tablet Take 1,000 mg by mouth every 6 (six) hours as needed (pain).    . cephALEXin (KEFLEX) 500 MG capsule Take 1 capsule (500 mg total) by mouth 4 (four) times daily. 20 capsule 0  . HYDROcodone-acetaminophen (NORCO/VICODIN) 5-325 MG tablet Take 1 tablet by mouth every 6 (six) hours as needed. 6 tablet 0  . ibuprofen (ADVIL,MOTRIN) 400 MG tablet Take 1 tablet (400 mg total) by mouth every 6 (six) hours as needed. 30 tablet 0  . meloxicam (MOBIC) 7.5 MG tablet Take 1 tablet (7.5 mg total) by mouth daily. 30 tablet 0   Social History   Socioeconomic History  . Marital status: Single    Spouse name: Not on file  . Number of children: Not on file  . Years of education: Not on file  . Highest education level: Not on file  Occupational History  . Not on file  Social Needs  . Financial resource strain: Not on file  . Food insecurity:    Worry: Not on file    Inability: Not on file  . Transportation needs:    Medical: Not on file   Non-medical: Not on file  Tobacco Use  . Smoking status: Never Smoker  . Smokeless tobacco: Never Used  Substance and Sexual Activity  . Alcohol use: No  . Drug use: No  . Sexual activity: Not on file  Lifestyle  . Physical activity:    Days per week: Not on file    Minutes per session: Not on file  . Stress: Not on file  Relationships  . Social connections:    Talks on phone: Not on file    Gets together: Not on file    Attends religious service: Not on file    Active member of club or organization: Not on file    Attends meetings of clubs or organizations: Not on file    Relationship status: Not on file  . Intimate partner violence:    Fear of current or ex partner: Not on file    Emotionally abused: Not on file    Physically abused: Not on file    Forced sexual activity: Not on file  Other Topics Concern  . Not on file  Social History Narrative  . Not on file   History reviewed. No pertinent family history.  OBJECTIVE: Vitals:   06/22/18 1630  BP: 103/67  Pulse: 89  Resp: 18  Temp: 97.7 F (36.5 C)  TempSrc: Temporal  SpO2: 100%    General appearance: alert; no distress  Lungs: clear to auscultation bilaterally Heart: regular rate and rhythm.  Radial pulse 2+ bilaterally. Cap refill <2 secs Extremities: no edema Skin: warm and dry; 1 cm abscess dorsal lateral aspect of proximal left third finger with approximately 1 cm of surrounding erythema; tender to palpation; active purulent drainage following warm soak Psychological: alert and cooperative; normal mood and affect        ASSESSMENT & PLAN:  1. Abscess   2. Cellulitis of finger of left hand     Meds ordered this encounter  Medications  . doxycycline (VIBRAMYCIN) 100 MG capsule    Sig: Take 1 capsule (100 mg total) by mouth 2 (two) times daily.    Dispense:  20 capsule    Refill:  0    Order Specific Question:   Supervising Provider    Answer:   Isa Rankin [[id number]]   Apply warm  compresses 3-4x daily for 10-15 minutes Wash site daily with warm water and mild soap Keep covered to avoid friction Take antibiotic as prescribed and to completion Follow up here or with PCP if symptoms persists Return or go to the ED if you have any new or worsening symptoms   Reviewed expectations re: course of current medical issues. Questions answered. Outlined signs and symptoms indicating need for more acute intervention. Patient verbalized understanding. After Visit Summary given.   Rennis Harding, PA-C 06/22/18 1704

## 2018-06-22 NOTE — ED Triage Notes (Addendum)
Left middle finger with an abscess for approx one week.  Site has been increasing since then.    Redness involves middle finger and redness and warmth stops at wrist.  Middle finger is swollen

## 2020-02-24 ENCOUNTER — Encounter (HOSPITAL_COMMUNITY): Payer: Self-pay

## 2020-02-24 ENCOUNTER — Emergency Department (HOSPITAL_COMMUNITY): Payer: PRIVATE HEALTH INSURANCE

## 2020-02-24 ENCOUNTER — Emergency Department (HOSPITAL_COMMUNITY)
Admission: EM | Admit: 2020-02-24 | Discharge: 2020-02-24 | Disposition: A | Discharge status: Home / Self Care | Payer: PRIVATE HEALTH INSURANCE | Attending: Emergency Medicine | Admitting: Emergency Medicine

## 2020-02-24 ENCOUNTER — Other Ambulatory Visit: Payer: Self-pay

## 2020-02-24 DIAGNOSIS — R198 Other specified symptoms and signs involving the digestive system and abdomen: Secondary | ICD-10-CM

## 2020-02-24 DIAGNOSIS — R0989 Other specified symptoms and signs involving the circulatory and respiratory systems: Secondary | ICD-10-CM | POA: Diagnosis present

## 2020-02-24 DIAGNOSIS — K228 Other specified diseases of esophagus: Secondary | ICD-10-CM

## 2020-02-24 MED ORDER — LIDOCAINE VISCOUS HCL 2 % MT SOLN: 15.0000 mL | OROMUCOSAL | 0 refills | Status: DC | PRN

## 2020-02-24 MED ORDER — LIDOCAINE VISCOUS HCL 2 % MT SOLN
15.0000 mL | Freq: Once | OROMUCOSAL | Status: AC
  Administered 2020-02-24: 15 mL via OROMUCOSAL
  Filled 2020-02-24: qty 15

## 2020-02-24 NOTE — ED Provider Notes (Signed)
[facility] DEPT Provider Note   CSN: [id number] Arrival date & time: 02/24/20  0809     History Chief Complaint  Patient presents with  . Swallowed Foreign Body    Sydney [name] is a 54 y.o. female.  HPI Patient presents with concern of possible foreign body impaction.  She notes that after swallowing a piece of mango, feeling likely broken tooth 3 days ago she has subsequently developed sensation of foreign body in the right mid throat.  She continues to be able to eat, drink, has no vomiting, no spitting up of sputum.  There is an irritated sensation in this area that is not improved with anything. No other new complaints of chest pain, dyspnea.  Patient has known poor dentition, does not have a dentist.  History reviewed. No pertinent past medical history.  Patient Active Problem List   Diagnosis Date Noted  . 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 TUBAL LIGATION       OB History   No obstetric history on file.     History reviewed. No pertinent family history.  Social History   Tobacco Use  . Smoking status: Never Smoker  . Smokeless tobacco: Never Used  Substance Use Topics  . Alcohol use: No  . Drug use: No    Home Medications Prior to Admission medications   Medication Sig Start Date End Date Taking? Authorizing Provider  acetaminophen (TYLENOL) 500 MG tablet Take 1,000 mg by mouth every 6 (six) hours as needed (pain).   Yes [provider]  cephALEXin (KEFLEX) 500 MG capsule Take 1 capsule (500 mg total) by mouth 4 (four) times daily. Patient not taking: Reported on 02/24/2020 11/01/16   de Villier, Daryl F II, PA  doxycycline (VIBRAMYCIN) 100 MG capsule Take 1 capsule (100 mg total) by mouth 2 (two) times daily. Patient not taking: Reported on 02/24/2020 06/22/18   Stacey Drain [place], PA-C  HYDROcodone-acetaminophen (NORCO/VICODIN) 5-325 MG tablet Take 1  tablet by mouth every 6 (six) hours as needed. Patient not taking: Reported on 02/24/2020 11/01/16   de Villier, Daryl F II, PA  ibuprofen (ADVIL,MOTRIN) 400 MG tablet Take 1 tablet (400 mg total) by mouth every 6 (six) hours as needed. Patient not taking: Reported on 02/24/2020 11/01/16   de Villier, Daryl F II, PA  meloxicam (MOBIC) 7.5 MG tablet Take 1 tablet (7.5 mg total) by mouth daily. Patient not taking: Reported on 02/24/2020 10/12/16   Wendie Agreste, MD    Allergies    Patient has no known allergies.  Review of Systems   Review of Systems  Constitutional:       Per HPI, otherwise negative  HENT:       Per HPI, otherwise negative  Respiratory:       Per HPI, otherwise negative  Cardiovascular:       Per HPI, otherwise negative  Gastrointestinal: Negative for vomiting.  Endocrine:       Negative aside from HPI  Genitourinary:       Neg aside from HPI   Musculoskeletal:       Per HPI, otherwise negative  Skin: Negative.   Neurological: Negative for syncope.    Physical Exam Updated Vital Signs BP 123/71 (BP Location: Left Arm)   Pulse 77   Temp 98.3 F (36.8 C) (Oral)   Resp 18   Ht 4\' 11"  (1.499 m)   Wt 51.7 kg  SpO2 100%   BMI 23.03 kg/m   Physical Exam Vitals and nursing note reviewed.  Constitutional:      General: She is not in acute distress.    Appearance: She is well-developed.  HENT:     Head: Normocephalic and atraumatic.     Mouth/Throat:   Eyes:     Conjunctiva/sclera: Conjunctivae normal.  Neck:   Cardiovascular:     Rate and Rhythm: Normal rate and regular rhythm.  Pulmonary:     Effort: Pulmonary effort is normal. No respiratory distress.     Breath sounds: Normal breath sounds. No stridor.  Abdominal:     General: There is no distension.  Skin:    General: Skin is warm and dry.  Neurological:     Mental Status: She is alert and oriented to person, place, and time.     Cranial Nerves: No cranial nerve deficit.     ED  Results / Procedures / Treatments    Radiology DG Neck Soft Tissue  Result Date: 02/24/2020 CLINICAL DATA:  The patient complains of a feeling of something stuck in her throat for 3 days. EXAM: NECK SOFT TISSUES - 1+ VIEW COMPARISON:  Cervical radiograph dated 09/27/2009 FINDINGS: There is no evidence of retropharyngeal soft tissue swelling or epiglottic enlargement. The cervical airway is unremarkable and no radio-opaque foreign body identified. Chronic disc space narrowing at C3-4 and C4-5 with chronic reversal of the upper cervical lordosis. IMPRESSION: No acute abnormalities. 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 Electronically Signed   By: Francene Boyers M.D.   On: 02/24/2020 10:38    Procedures Procedures (including critical care time)  Medications Ordered in ED Medications  lidocaine (XYLOCAINE) 2 % viscous mouth solution 15 mL (15 mLs Mouth/Throat Given 02/24/20 0944)    ED Course  I have reviewed the triage vital signs and the nursing notes.  Pertinent labs & imaging results that were available during my care of the patient were reviewed by me and considered in my medical decision making (see chart for details).  11:08 AM After receiving lidocaine the patient is resting comfortably, exhibiting no evidence for distress, has not been spitting up her sputum.  I reviewed the x-ray with her, discussed the absence of evidence for retained foreign body and given the absence of distress, inability to tolerate fluids, the patient is appropriate for close outpatient follow-up.  In addition to following up with ENT, patient provided dental resources, started on lidocaine. Final Clinical Impression(s) / ED Diagnoses Final diagnoses:  Sensation of foreign body in esophagus    Rx / DC Orders ED Discharge Orders         Ordered    lidocaine (XYLOCAINE) 2 % solution  As needed     02/24/20 1103           Gerhard Munch, MD 02/24/20 1109

## 2020-02-24 NOTE — ED Triage Notes (Signed)
Pt presents with c/o possible foreign body in her throat. Pt reports that she was eating mango 2 days ago and feels like she has something stuck in her throat. Pt believes it to be either a piece of mango or a tooth as she reports a tooth is now missing that was there prior to eating the mango. Pt reports she is still able to eat food. Pt is in no distress, talking in complete sentences, no respiratory issues.

## 2020-02-24 NOTE — Discharge Instructions (Addendum)
As discussed, today's evaluation has been generally reassuring. There is no x-ray evidence for a retained foreign body in your esophagus.  Your symptoms are likely due to the irritation of a previously passed piece of food or tooth.  Occasionally a small piece is still there, and if you do not improve, please be sure to follow up with her specialist in his clinic.  In addition, please follow-up with a dentist.  Use the provided resources.

## 2020-12-03 ENCOUNTER — Other Ambulatory Visit: Payer: Self-pay

## 2020-12-03 ENCOUNTER — Ambulatory Visit (HOSPITAL_COMMUNITY)
Admission: EM | Admit: 2020-12-03 | Discharge: 2020-12-03 | Disposition: A | Discharge status: Home / Self Care | Payer: Managed Care, Other (non HMO) | Attending: Family Medicine | Admitting: Family Medicine

## 2020-12-03 ENCOUNTER — Encounter (HOSPITAL_COMMUNITY): Payer: Self-pay | Admitting: *Deleted

## 2020-12-03 DIAGNOSIS — Z202 Contact with and (suspected) exposure to infections with a predominantly sexual mode of transmission: Secondary | ICD-10-CM | POA: Insufficient documentation

## 2020-12-03 MED ORDER — METRONIDAZOLE 500 MG PO TABS: 2000.0000 mg | ORAL_TABLET | Freq: Once | ORAL | 0 refills | Status: AC

## 2020-12-03 MED ORDER — METRONIDAZOLE 500 MG PO TABS: 2000.0000 mg | ORAL_TABLET | Freq: Once | ORAL | Status: DC

## 2020-12-03 NOTE — Discharge Instructions (Addendum)
Swab sent for testing.  We have treated you for trichomonas today based on exposure. Follow up as needed for continued or worsening symptoms

## 2020-12-03 NOTE — ED Triage Notes (Addendum)
Pt denies any sxs.  STates was told she has been exposed to trichomonas.  Requesting STD check.

## 2020-12-04 LAB — CERVICOVAGINAL ANCILLARY ONLY
Bacterial Vaginitis (gardnerella): POSITIVE — AB
Candida Glabrata: NEGATIVE
Candida Vaginitis: NEGATIVE
Chlamydia: NEGATIVE
Comment: NEGATIVE
Comment: NEGATIVE
Comment: NEGATIVE
Comment: NEGATIVE
Comment: NEGATIVE
Comment: NORMAL
Neisseria Gonorrhea: NEGATIVE
Trichomonas: NEGATIVE

## 2020-12-04 NOTE — ED Provider Notes (Signed)
[facility]    CSN: [id number] Arrival date & time: 12/03/20  1008      History   Chief Complaint Chief Complaint  Patient presents with  . Exposure to STD    HPI ABRINA PETZ is a 54 y.o. female.   Patient is a 54 year old female who presents today for possible exposure to STD.  Was told she had been exposed to trichomonas.  Denies any current symptoms.     History reviewed. No pertinent past medical history.  Patient Active Problem List   Diagnosis Date Noted  . 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 TUBAL LIGATION      OB History   No obstetric history on file.      Home Medications    Prior to Admission medications   Medication Sig Start Date End Date Taking? Authorizing Provider  acetaminophen (TYLENOL) 500 MG tablet Take 1,000 mg by mouth every 6 (six) hours as needed (pain).    [provider]    Family History History reviewed. No pertinent family history.  Social History Social History   Tobacco Use  . Smoking status: Never Smoker  . Smokeless tobacco: Never Used  Vaping Use  . Vaping Use: Never used  Substance Use Topics  . Alcohol use: No  . Drug use: No     Allergies   Patient has no known allergies.   Review of Systems Review of Systems   Physical Exam Triage Vital Signs ED Triage Vitals  Enc Vitals Group     BP 12/03/20 1029 102/70     Pulse Rate 12/03/20 1029 86     Resp 12/03/20 1029 18     Temp 12/03/20 1029 97.9 F (36.6 C)     Temp Source 12/03/20 1029 Oral     SpO2 12/03/20 1029 98 %     Weight --      Height --      Head Circumference --      Peak Flow --      Pain Score 12/03/20 1028 0     Pain Loc --      Pain Edu? --      Excl. in GC? --    No data found.  Updated Vital Signs BP 102/70   Pulse 86   Temp 97.9 F (36.6 C) (Oral)   Resp 18   LMP 09/28/2016 (Approximate)   SpO2 98%   Visual Acuity Right Eye  Distance:   Left Eye Distance:   Bilateral Distance:    Right Eye Near:   Left Eye Near:    Bilateral Near:     Physical Exam Vitals and nursing note reviewed.  Constitutional:      General: She is not in acute distress.    Appearance: Normal appearance. She is not ill-appearing, toxic-appearing or diaphoretic.  HENT:     Head: Normocephalic.  Eyes:     Conjunctiva/sclera: Conjunctivae normal.  Pulmonary:     Effort: Pulmonary effort is normal.  Musculoskeletal:        General: Normal range of motion.     Cervical back: Normal range of motion.  Skin:    General: Skin is warm and dry.     Findings: No rash.  Neurological:     Mental Status: She is alert.  Psychiatric:        Mood and Affect: Mood normal.      UC Treatments /  Results  Labs (all labs ordered are listed, but only abnormal results are displayed) Labs Reviewed  CERVICOVAGINAL ANCILLARY ONLY    EKG   Radiology No results found.  Procedures Procedures (including critical care time)  Medications Ordered in UC Medications - No data to display  Initial Impression / Assessment and Plan / UC Course  I have reviewed the triage vital signs and the nursing notes.  Pertinent labs & imaging results that were available during my care of the patient were reviewed by me and considered in my medical decision making (see chart for details).     STD exposure Send prescription to the pharmacy to treat for trichomonas. Swab sent for STD testing Follow up as needed for continued or worsening symptoms  Final Clinical Impressions(s) / UC Diagnoses   Final diagnoses:  STD exposure     Discharge Instructions     Swab sent for testing.  We have treated you for trichomonas today based on exposure. Follow up as needed for continued or worsening symptoms     ED Prescriptions    Medication Sig Dispense Auth. Provider   metroNIDAZOLE (FLAGYL) 500 MG tablet Take 4 tablets (2,000 mg total) by mouth once for  1 dose. 4 tablet Dahlia Byes A, NP     PDMP not reviewed this encounter.   Janace Aris, NP 12/04/20 [phone]

## 2020-12-05 ENCOUNTER — Telehealth (HOSPITAL_COMMUNITY): Payer: Self-pay | Admitting: Emergency Medicine

## 2020-12-05 MED ORDER — METRONIDAZOLE 0.75 % VA GEL: 1.0000 | Freq: Every day | VAGINAL | 0 refills | Status: AC

## 2021-12-25 ENCOUNTER — Emergency Department (HOSPITAL_COMMUNITY)
Admission: EM | Admit: 2021-12-25 | Discharge: 2021-12-25 | Disposition: A | Discharge status: Home / Self Care | Payer: Managed Care, Other (non HMO) | Attending: Emergency Medicine | Admitting: Emergency Medicine

## 2021-12-25 ENCOUNTER — Encounter (HOSPITAL_COMMUNITY): Payer: Self-pay | Admitting: Emergency Medicine

## 2021-12-25 ENCOUNTER — Other Ambulatory Visit: Payer: Self-pay

## 2021-12-25 DIAGNOSIS — M7981 Nontraumatic hematoma of soft tissue: Secondary | ICD-10-CM | POA: Insufficient documentation

## 2021-12-25 DIAGNOSIS — R791 Abnormal coagulation profile: Secondary | ICD-10-CM | POA: Insufficient documentation

## 2021-12-25 DIAGNOSIS — H05232 Hemorrhage of left orbit: Secondary | ICD-10-CM

## 2021-12-25 LAB — CBC WITH DIFFERENTIAL/PLATELET
Abs Immature Granulocytes: 0.01 10*3/uL (ref 0.00–0.07)
Basophils Absolute: 0 10*3/uL (ref 0.0–0.1)
Basophils Relative: 0 %
Eosinophils Absolute: 0.1 10*3/uL (ref 0.0–0.5)
Eosinophils Relative: 2 %
HCT: 41.9 % (ref 36.0–46.0)
Hemoglobin: 13.5 g/dL (ref 12.0–15.0)
Immature Granulocytes: 0 %
Lymphocytes Relative: 30 %
Lymphs Abs: 2.2 10*3/uL (ref 0.7–4.0)
MCH: 29.7 pg (ref 26.0–34.0)
MCHC: 32.2 g/dL (ref 30.0–36.0)
MCV: 92.3 fL (ref 80.0–100.0)
Monocytes Absolute: 0.6 10*3/uL (ref 0.1–1.0)
Monocytes Relative: 8 %
Neutro Abs: 4.3 10*3/uL (ref 1.7–7.7)
Neutrophils Relative %: 60 %
Platelets: 317 10*3/uL (ref 150–400)
RBC: 4.54 MIL/uL (ref 3.87–5.11)
RDW: 14.6 % (ref 11.5–15.5)
WBC: 7.2 10*3/uL (ref 4.0–10.5)
nRBC: 0 % (ref 0.0–0.2)

## 2021-12-25 LAB — BASIC METABOLIC PANEL
Anion gap: 7 (ref 5–15)
BUN: 9 mg/dL (ref 6–20)
CO2: 28 mmol/L (ref 22–32)
Calcium: 9.8 mg/dL (ref 8.9–10.3)
Chloride: 108 mmol/L (ref 98–111)
Creatinine, Ser: 0.83 mg/dL (ref 0.44–1.00)
GFR, Estimated: >60 mL/min (ref >60)
Glucose, Bld: 113 mg/dL — ABNORMAL HIGH (ref 70–99)
Potassium: 4.9 mmol/L (ref 3.5–5.1)
Sodium: 143 mmol/L (ref 135–145)

## 2021-12-25 LAB — PROTIME-INR
INR: 1 (ref 0.8–1.2)
Prothrombin Time: 12.6 seconds (ref 11.4–15.2)

## 2021-12-25 NOTE — ED Triage Notes (Signed)
Patient here with spontaneous eye swelling.  She states that she felt something pop under her eye, she has swelling and brusing under left eye.  Patient denies any injury.

## 2021-12-25 NOTE — ED Provider Notes (Signed)
[facility] EMERGENCY DEPARTMENT Provider Note   CSN: [id number] Arrival date & time: 12/25/21  [id number]     History  Chief Complaint  Patient presents with   Eye Pain    Sydney [name] is a 56 y.o. female with medical history of tubal ligation.  Patient states that last night at 12:30 AM she was folding laundry when she looked in the mirror and noticed a bruise underneath her left eye.  Patient states that the bruise spontaneously presented, denies any recent trauma or assault or known injuries that would cause the bruise to form.  Patient denies any sneezing or coughing fits that occurred around the time of bruise formation. Patient denies any pain to the left eye, blurry vision, pain with upward gaze, photophobia, double vision.    Eye Pain      Home Medications Prior to Admission medications   Medication Sig Start Date End Date Taking? Authorizing Provider  acetaminophen (TYLENOL) 500 MG tablet Take 1,000 mg by mouth every 6 (six) hours as needed (pain).    [provider]      Allergies    Patient has no known allergies.    Review of Systems   Review of Systems  Eyes:  Positive for pain. Negative for photophobia, discharge and visual disturbance.  All other systems reviewed and are negative.  Physical Exam Updated Vital Signs BP 124/70    Pulse 65    Temp (!) 97.4 F (36.3 C) (Oral)    Resp 18    LMP 09/28/2016 (Approximate)    SpO2 99%  Physical Exam Vitals and nursing note reviewed.  Constitutional:      General: She is not in acute distress.    Appearance: Normal appearance. She is not ill-appearing or toxic-appearing.  HENT:     Head: Normocephalic.     Nose: Nose normal.     Mouth/Throat:     Mouth: Mucous membranes are moist.  Eyes:     General: Lids are normal. Lids are everted, no foreign bodies appreciated. Vision grossly intact. Gaze aligned appropriately. No scleral icterus.       Right eye: No discharge.        Left eye: No  discharge.     Extraocular Movements: Extraocular movements intact.     Right eye: Normal extraocular motion and no nystagmus.     Left eye: Normal extraocular motion and no nystagmus.     Pupils: Pupils are equal, round, and reactive to light.     Comments: 2 inch hematoma noted underneath left eye.  Cardiovascular:     Rate and Rhythm: Normal rate and regular rhythm.     Pulses: Normal pulses.  Pulmonary:     Effort: Pulmonary effort is normal.     Breath sounds: Normal breath sounds. No wheezing.  Abdominal:     General: Abdomen is flat.     Palpations: Abdomen is soft.     Tenderness: There is no abdominal tenderness.  Musculoskeletal:     Cervical back: Normal range of motion. No rigidity or tenderness.  Skin:    General: Skin is warm and dry.     Capillary Refill: Capillary refill takes less than 2 seconds.     Findings: Bruising present.     Comments: 2 inch hematoma noted underneath left eye.  Neurological:     General: No focal deficit present.     Mental Status: She is alert and oriented to person, place, and time.  Psychiatric:  Mood and Affect: Mood normal.    ED Results / Procedures / Treatments   Labs (all labs ordered are listed, but only abnormal results are displayed) Labs Reviewed  BASIC METABOLIC PANEL - Abnormal; Notable for the following components:      Result Value   Glucose, Bld 113 (*)    All other components within normal limits  CBC WITH DIFFERENTIAL/PLATELET  PROTIME-INR    EKG None  Radiology No results found.  Procedures Procedures    Medications Ordered in ED Medications - No data to display  ED Course/ Medical Decision Making/ A&P                           Medical Decision Making  56 year old female presents to ED for concern of hematoma underneath left eye.  Patient denies any recent trauma, injury, assault.  Patient denies any coughing fits or sneezing fits prior to appearance of bruise.  Patient work-up has been  largely unrevealing here.  Patient platelet count within normal limits, patient PT/INR within normal limits.  I have personally reviewed these labs.  Patient does not have any pain with upward or downward gaze, no vision changes, no tenderness to palpation around the orbit on the left side.  There seems to be no indication for imaging at this time.  Patient states that she does not see a PCP and has not seen one in a number of years.  At this time, etiology of patient bruising underneath left eye is unknown.  Patient adamantly denies any trauma or assault.  Patient vision is grossly intact.  Patient denies any recent medication changes.  I discussed this patient and her case with Dr. Jeanell Sparrow who is in agreement with plan for discharge and outpatient follow-up.  I have given the patient return precautions which she voiced understanding with.  I have advised the patient that she should see a PCP in the next 7 to 10 days for ongoing evaluation and management of this problem if it were to continue. I will refer her to one.  I provided education of this patient.  This patient is stable for discharge.   Final Clinical Impression(s) / ED Diagnoses Final diagnoses:  Periorbital hematoma of left eye    Rx / DC Orders ED Discharge Orders     None         Azucena Cecil, PA-C 12/25/21 0935    Pattricia Boss, MD 12/25/21 [phone]

## 2021-12-25 NOTE — ED Provider Notes (Signed)
Emergency Medicine Provider Triage Evaluation Note  Sydney [name] , a 56 y.o. female  was evaluated in triage.  Pt complains of periorbital contusion around left eye,.  She states that she was folding laundry and had a sudden discomfort under her left eye.  She states the eye was hurting earlier but is not hurting currently.  No vision changes.  She states that when she looked in the mirror she had contusion around the eye.  She denies any trauma.  No headache.  No blood thinners.  Review of Systems  Positive:  Negative: See above  Physical Exam  BP 128/71    Pulse 77    Temp 98.5 F (36.9 C) (Oral)    Resp 16    LMP 09/28/2016 (Approximate)    SpO2 100%  Gen:   Awake, no distress   Resp:  Normal effort  MSK:   Moves extremities without difficulty  Other:  Left eye has hematoma under the eye.   Medical Decision Making  Medically screening exam initiated at 4:28 AM.  Appropriate orders placed.  Sydney [name] was informed that the remainder of the evaluation will be completed by another provider, this initial triage assessment does not replace that evaluation, and the importance of remaining in the ED until their evaluation is complete.  Patient with ported spontaneous hematoma inferior to the left eye.  She adamantly denies any trauma or head injury.  She does not take any blood thinning medications.  She denies any activity such as coughing, sneezing, or any other activity that would have increased risk for spontaneous hemorrhage. She has not had labs in at least 3 years, will check basic labs to ensure she has not developed a thrombocytopenia or other pathologic cause of her contusion.   Note: Portions of this report may have been transcribed using voice recognition software. Every effort was made to ensure accuracy; however, inadvertent computerized transcription errors may be present    Lorin Glass, PA-C 12/25/21 [id number]    Merryl Hacker, MD 12/25/21 2302

## 2021-12-25 NOTE — Discharge Instructions (Addendum)
Return to ED with any new or worsening symptoms such as pain with upward gaze, trouble seeing, blood in your eye Please follow-up with the PCP in 7 to 10 days for ongoing management of this problem if it is to continue You may use ice packs to help the hematoma resorb.  Place a covered ice pack over your eye for 5 to 10 minutes at a time.
